# Patient Record
Sex: Female | Born: 2000 | Race: White | Hispanic: No | Marital: Single
Health system: Southern US, Community
[De-identification: ages and names within clinical notes are randomized; demographics above are authoritative.]

## PROBLEM LIST (undated history)

## (undated) DIAGNOSIS — O24419 Gestational diabetes mellitus in pregnancy, unspecified control: Secondary | ICD-10-CM

## (undated) DIAGNOSIS — Z789 Other specified health status: Secondary | ICD-10-CM

## (undated) DIAGNOSIS — D649 Anemia, unspecified: Secondary | ICD-10-CM

## (undated) HISTORY — DX: Other specified health status: Z78.9

## (undated) HISTORY — DX: Gestational diabetes mellitus in pregnancy, unspecified control: O24.419

## (undated) HISTORY — DX: Anemia, unspecified: D64.9

## (undated) HISTORY — PX: FINGER SURGERY: SHX640

---
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
## 2021-01-01 DIAGNOSIS — O99213 Obesity complicating pregnancy, third trimester: Secondary | ICD-10-CM | POA: Diagnosis not present

## 2021-01-01 DIAGNOSIS — O98513 Other viral diseases complicating pregnancy, third trimester: Secondary | ICD-10-CM | POA: Diagnosis not present

## 2021-01-01 DIAGNOSIS — O99013 Anemia complicating pregnancy, third trimester: Secondary | ICD-10-CM | POA: Diagnosis not present

## 2021-01-01 DIAGNOSIS — O0993 Supervision of high risk pregnancy, unspecified, third trimester: Secondary | ICD-10-CM | POA: Diagnosis not present

## 2021-01-03 ENCOUNTER — Other Ambulatory Visit: Payer: Self-pay | Admitting: Obstetrics and Gynecology

## 2021-01-03 NOTE — Progress Notes (Signed)
OB History & Physical   History of Present Illness:  Chief Complaint:   HPI:  Samantha Cabrera is a 20 y.o. G1P0000 female at [redacted]w[redacted]d dated by LMP of 04/01/20.  She presents to L&D for elective IOL.  Pregnancy Issues: 1. S<D 12/13/20 2. COVID in pregnancy  3. Teen pregnancy  4. Obesity - BMI 41 5. Late entry into prenatal care  6. Abnormal thyroid screen 7. Anemia in pregnancy  8. Rh Negative     Prenatal care site: [facility]   EFW: 12/22/20: AUA: [redacted]w[redacted]d EFW: 3295 g 57%ile   Pertinent Results:  Prenatal Labs: Blood type/Rh B neg  Antibody screen neg  Rubella Immune  Varicella Immune  RPR NR  HBsAg Neg  HIV NR  GC neg  Chlamydia neg  Genetic screening declined  1 hour GTT 100  3 hour GTT   GBS Neg   Post Partum Planning: - Infant feeding: TBD - Contraception: TBD - Flu: declined - Tdap: Given 11/28/20  Haroldine Laws, CNM 01/03/2021 8:18 PM

## 2021-01-04 ENCOUNTER — Inpatient Hospital Stay
Admission: EM | Admit: 2021-01-04 | Discharge: 2021-01-06 | DRG: 807 | Disposition: A | Discharge status: Home / Self Care | Payer: Medicaid Other

## 2021-01-04 ENCOUNTER — Encounter: Payer: Self-pay | Admitting: Obstetrics and Gynecology

## 2021-01-04 ENCOUNTER — Other Ambulatory Visit: Payer: Self-pay

## 2021-01-04 ENCOUNTER — Inpatient Hospital Stay: Payer: Medicaid Other | Admitting: Anesthesiology

## 2021-01-04 DIAGNOSIS — D649 Anemia, unspecified: Secondary | ICD-10-CM | POA: Diagnosis present

## 2021-01-04 DIAGNOSIS — Z8616 Personal history of COVID-19: Secondary | ICD-10-CM

## 2021-01-04 DIAGNOSIS — Z6791 Unspecified blood type, Rh negative: Secondary | ICD-10-CM

## 2021-01-04 DIAGNOSIS — O99214 Obesity complicating childbirth: Secondary | ICD-10-CM | POA: Diagnosis present

## 2021-01-04 DIAGNOSIS — Z880 Allergy status to penicillin: Secondary | ICD-10-CM | POA: Diagnosis not present

## 2021-01-04 DIAGNOSIS — O9902 Anemia complicating childbirth: Secondary | ICD-10-CM | POA: Diagnosis not present

## 2021-01-04 DIAGNOSIS — O26893 Other specified pregnancy related conditions, third trimester: Secondary | ICD-10-CM | POA: Diagnosis not present

## 2021-01-04 DIAGNOSIS — Z3A39 39 weeks gestation of pregnancy: Secondary | ICD-10-CM

## 2021-01-04 DIAGNOSIS — Z3490 Encounter for supervision of normal pregnancy, unspecified, unspecified trimester: Secondary | ICD-10-CM

## 2021-01-04 DIAGNOSIS — E669 Obesity, unspecified: Secondary | ICD-10-CM | POA: Diagnosis present

## 2021-01-04 DIAGNOSIS — O26843 Uterine size-date discrepancy, third trimester: Secondary | ICD-10-CM | POA: Insufficient documentation

## 2021-01-04 HISTORY — DX: Encounter for supervision of normal pregnancy, unspecified, unspecified trimester: Z34.90

## 2021-01-04 LAB — CBC
HCT: 32.5 % — ABNORMAL LOW (ref 36.0–46.0)
Hemoglobin: 10.3 g/dL — ABNORMAL LOW (ref 12.0–15.0)
MCH: 25.8 pg — ABNORMAL LOW (ref 26.0–34.0)
MCHC: 31.7 g/dL (ref 30.0–36.0)
MCV: 81.5 fL (ref 80.0–100.0)
Platelets: 196 10*3/uL (ref 150–400)
RBC: 3.99 MIL/uL (ref 3.87–5.11)
RDW: 15.5 % (ref 11.5–15.5)
WBC: 10.9 10*3/uL — ABNORMAL HIGH (ref 4.0–10.5)
nRBC: 0 % (ref 0.0–0.2)

## 2021-01-04 LAB — TYPE AND SCREEN
ABO/RH(D): B NEG
Antibody Screen: POSITIVE

## 2021-01-04 LAB — RPR: RPR Ser Ql: NONREACTIVE

## 2021-01-04 MED ORDER — OXYTOCIN 10 UNIT/ML IJ SOLN
INTRAMUSCULAR | Status: AC
  Filled 2021-01-04: qty 2

## 2021-01-04 MED ORDER — MISOPROSTOL 25 MCG QUARTER TABLET: 25.0000 ug | ORAL_TABLET | ORAL | Status: DC

## 2021-01-04 MED ORDER — ONDANSETRON HCL 4 MG/2ML IJ SOLN
4.0000 mg | Freq: Four times a day (QID) | INTRAMUSCULAR | Status: DC | PRN
  Administered 2021-01-04: 4 mg via INTRAVENOUS
  Filled 2021-01-04: qty 2

## 2021-01-04 MED ORDER — EPHEDRINE 5 MG/ML INJ: 10.0000 mg | INTRAVENOUS | Status: DC | PRN

## 2021-01-04 MED ORDER — LIDOCAINE-EPINEPHRINE (PF) 1.5 %-1:200000 IJ SOLN
INTRAMUSCULAR | Status: DC | PRN
  Administered 2021-01-04: 3 mL via PERINEURAL

## 2021-01-04 MED ORDER — LACTATED RINGERS IV SOLN
500.0000 mL | Freq: Once | INTRAVENOUS | Status: AC
  Administered 2021-01-04: 500 mL via INTRAVENOUS

## 2021-01-04 MED ORDER — OXYTOCIN BOLUS FROM INFUSION
333.0000 mL | Freq: Once | INTRAVENOUS | Status: AC
  Administered 2021-01-05: 333 mL via INTRAVENOUS

## 2021-01-04 MED ORDER — DIPHENHYDRAMINE HCL 50 MG/ML IJ SOLN: 12.5000 mg | INTRAMUSCULAR | Status: DC | PRN

## 2021-01-04 MED ORDER — ACETAMINOPHEN 325 MG PO TABS
650.0000 mg | ORAL_TABLET | ORAL | Status: DC | PRN
  Administered 2021-01-05: 650 mg via ORAL
  Filled 2021-01-04: qty 2

## 2021-01-04 MED ORDER — LACTATED RINGERS IV SOLN
500.0000 mL | INTRAVENOUS | Status: DC | PRN
  Administered 2021-01-04: 500 mL via INTRAVENOUS

## 2021-01-04 MED ORDER — AMMONIA AROMATIC IN INHA
RESPIRATORY_TRACT | Status: AC
  Filled 2021-01-04: qty 10

## 2021-01-04 MED ORDER — LIDOCAINE HCL (PF) 1 % IJ SOLN
INTRAMUSCULAR | Status: DC | PRN
  Administered 2021-01-04: 2 mL

## 2021-01-04 MED ORDER — SOD CITRATE-CITRIC ACID 500-334 MG/5ML PO SOLN: 30.0000 mL | ORAL | Status: DC | PRN

## 2021-01-04 MED ORDER — MISOPROSTOL 25 MCG QUARTER TABLET
25.0000 ug | ORAL_TABLET | Freq: Once | ORAL | Status: AC
  Administered 2021-01-04: 25 ug via BUCCAL
  Filled 2021-01-04: qty 1

## 2021-01-04 MED ORDER — BUPIVACAINE HCL (PF) 0.25 % IJ SOLN
INTRAMUSCULAR | Status: DC | PRN
  Administered 2021-01-04: 3 mL via EPIDURAL
  Administered 2021-01-04: 4 mL via EPIDURAL

## 2021-01-04 MED ORDER — PHENYLEPHRINE 40 MCG/ML (10ML) SYRINGE FOR IV PUSH (FOR BLOOD PRESSURE SUPPORT): 80.0000 ug | PREFILLED_SYRINGE | INTRAVENOUS | Status: DC | PRN

## 2021-01-04 MED ORDER — OXYCODONE-ACETAMINOPHEN 5-325 MG PO TABS: 2.0000 | ORAL_TABLET | ORAL | Status: DC | PRN

## 2021-01-04 MED ORDER — LIDOCAINE HCL (PF) 1 % IJ SOLN
30.0000 mL | INTRAMUSCULAR | Status: DC | PRN
Start: 2021-01-04 — End: 2021-01-05

## 2021-01-04 MED ORDER — MISOPROSTOL 25 MCG QUARTER TABLET
25.0000 ug | ORAL_TABLET | ORAL | Status: DC | PRN
Start: 2021-01-04 — End: 2021-01-05
  Administered 2021-01-04 (×2): 25 ug via VAGINAL
  Filled 2021-01-04 (×2): qty 1

## 2021-01-04 MED ORDER — FENTANYL 2.5 MCG/ML W/ROPIVACAINE 0.15% IN NS 100 ML EPIDURAL (ARMC)
12.0000 mL/h | EPIDURAL | Status: DC
  Administered 2021-01-04 – 2021-01-05 (×2): 12 mL/h via EPIDURAL
  Filled 2021-01-04: qty 100

## 2021-01-04 MED ORDER — FENTANYL 2.5 MCG/ML W/ROPIVACAINE 0.15% IN NS 100 ML EPIDURAL (ARMC)
EPIDURAL | Status: AC
  Filled 2021-01-04: qty 100

## 2021-01-04 MED ORDER — LACTATED RINGERS AMNIOINFUSION
INTRAVENOUS | Status: DC
  Filled 2021-01-04 (×3): qty 1000

## 2021-01-04 MED ORDER — OXYTOCIN-SODIUM CHLORIDE 30-0.9 UT/500ML-% IV SOLN
2.5000 [IU]/h | INTRAVENOUS | Status: DC
  Filled 2021-01-04 (×2): qty 500

## 2021-01-04 MED ORDER — LACTATED RINGERS IV SOLN: INTRAVENOUS | Status: DC

## 2021-01-04 MED ORDER — MISOPROSTOL 200 MCG PO TABS
ORAL_TABLET | ORAL | Status: AC
  Filled 2021-01-04: qty 4

## 2021-01-04 MED ORDER — TERBUTALINE SULFATE 1 MG/ML IJ SOLN: 0.2500 mg | Freq: Once | INTRAMUSCULAR | Status: DC | PRN

## 2021-01-04 MED ORDER — LIDOCAINE HCL (PF) 1 % IJ SOLN
INTRAMUSCULAR | Status: AC
  Filled 2021-01-04: qty 30

## 2021-01-04 MED ORDER — OXYCODONE-ACETAMINOPHEN 5-325 MG PO TABS: 1.0000 | ORAL_TABLET | ORAL | Status: DC | PRN

## 2021-01-04 MED ORDER — OXYTOCIN-SODIUM CHLORIDE 30-0.9 UT/500ML-% IV SOLN
1.0000 m[IU]/min | INTRAVENOUS | Status: DC
  Administered 2021-01-04: 2 m[IU]/min via INTRAVENOUS

## 2021-01-04 NOTE — Progress Notes (Signed)
Labor Progress Note  Samantha Cabrera is a 20 y.o. G1P0000 at [redacted]w[redacted]d by LMP admitted for induction of labor due to Elective at term and covid-19 infection in 3rd trimester.  Subjective: feeling more uncomfortable with contractions  Objective: BP (!) 109/58 (BP Location: Right Arm)   Pulse (!) 102   Temp 98 F (36.7 C) (Oral)   Resp 18   Ht 4\' 11"  (1.499 m)   Wt 102.1 kg   LMP 04/01/2020 (Exact Date) Comment: normal  BMI 45.44 kg/m  Notable VS details: reviewed   Fetal Assessment: FHT:  FHR: 135 bpm, variability: moderate,  accelerations:  Present,  decelerations:  Present prolong and intermittent variables  Category/reactivity:  Category II UC:   regular, every 2-4 minutes - IUPC placed after AROM SVE:   3/70/-2 Membrane status: AROM at 1737 Amniotic color: Clear  Labs: Lab Results  Component Value Date   WBC 10.9 (H) 01/04/2021   HGB 10.3 (L) 01/04/2021   HCT 32.5 (L) 01/04/2021   MCV 81.5 01/04/2021   PLT 196 01/04/2021    Assessment / Plan: S/P spontaneous expulsion of cervical balloon.   -AROM performed for clear fluid and IUPC placed -Plan for amnioinfusion for variable decelerations  -Dr. 03/06/2021 notified of EFM and interventions   Labor: Early induced labor  Preeclampsia:  no signs or symptoms of toxicity Fetal Wellbeing:  Category II  -Intermittent prolong decel and intermittent variables  -Resolve with conservative measures  -Decelerations occur more frequently when using upright or non-lateral positions   Pain Control:  Labor support without medications I/D:  n/a Anticipated MOD:  NSVD  Feliberto Gottron, CNM 01/04/2021, 5:53 PM

## 2021-01-04 NOTE — H&P (Signed)
OB History & Physical   History of Present Illness:  Chief Complaint:   HPI:  Samantha Cabrera is a 20 y.o. G1P0000 female at [redacted]w[redacted]d dated by LMP.  She presents to L&D for elective IOL  She reports:  -active fetal movement -no leakage of fluid -no vaginal bleeding -no contractions  Pregnancy Issues: 1. S<D 12/13/20 2. COVID in pregnancy  3. Teen pregnancy  4. Obesity - BMI 41 5. Late entry into prenatal care  6. Abnormal thyroid screen 7. Anemia in pregnancy  8. Rh Negative    Maternal Medical History:   Past Medical History:  Diagnosis Date   Medical history non-contributory    Patient denies medical problems     Past Surgical History:  Procedure Laterality Date   FINGER SURGERY      Allergies  Allergen Reactions   Penicillins Hives and Swelling    Tongue swells   Augmentin [Amoxicillin-Pot Clavulanate] Hives   Latex Dermatitis   Amoxicillin Rash    Prior to Admission medications   Medication Sig Start Date End Date Taking? Authorizing Provider  ondansetron (ZOFRAN) 4 MG tablet Take 4 mg by mouth every 8 (eight) hours as needed for nausea or vomiting.   Yes [provider]  Prenatal Vit-Fe Fumarate-FA (PRENATAL MULTIVITAMIN) TABS tablet Take 1 tablet by mouth daily at 12 noon.   Yes [provider]  albuterol (PROVENTIL HFA;VENTOLIN HFA) 108 (90 BASE) MCG/ACT inhaler Inhale 2 puffs into the lungs every 6 (six) hours as needed for wheezing or shortness of breath. Patient not taking: No sig reported 08/29/15   Menshew, Charlesetta Ivory, PA-C     Prenatal care site: [facility]   Social History: She  reports that she has never smoked. She has never used smokeless tobacco. She reports that she does not drink alcohol and does not use drugs.  Family History: family history is not on file.   Review of Systems: A full review of systems was performed and negative except as noted in the HPI.    Physical Exam:  Vital Signs: BP  134/80    Pulse (!) 102    Temp 97.8 F (36.6 C) (Oral)    Resp 18    Ht 4\' 11"  (1.499 m)    Wt 102.1 kg    LMP 04/01/2020 (Exact Date) Comment: normal   BMI 45.44 kg/m   General:   alert and cooperative  Skin:  normal  Neurologic:    Alert & oriented x 3  Lungs:   nl effort  Heart:   regular rate and rhythm  Abdomen:  soft, non-tender; bowel sounds normal; no masses,  no organomegaly  Extremities: : non-tender, symmetric    EFW: 12/22/20: AUA: [redacted]w[redacted]d EFW: 3295 g 57%ile  Results for orders placed or performed during the hospital encounter of 01/04/21 (from the past 24 hour(s))  CBC     Status: Abnormal   Collection Time: 01/04/21 12:41 AM  Result Value Ref Range   WBC 10.9 (H) 4.0 - 10.5 K/uL   RBC 3.99 3.87 - 5.11 MIL/uL   Hemoglobin 10.3 (L) 12.0 - 15.0 g/dL   HCT 03/06/21 (L) 25.8 - 52.7 %   MCV 81.5 80.0 - 100.0 fL   MCH 25.8 (L) 26.0 - 34.0 pg   MCHC 31.7 30.0 - 36.0 g/dL   RDW 78.2 42.3 - 53.6 %   Platelets 196 150 - 400 K/uL   nRBC 0.0 0.0 - 0.2 %  Type and screen  Status: None (Preliminary result)   Collection Time: 01/04/21  1:01 AM  Result Value Ref Range   ABO/RH(D) PENDING    Antibody Screen PENDING    Sample Expiration      01/07/2021,2359 Performed at [facility], [address] [place], [place] [address]   Type and screen     Status: None   Collection Time: 01/04/21  1:33 AM  Result Value Ref Range   ABO/RH(D) B NEG    Antibody Screen POS    Sample Expiration 01/07/2021,2359    Antibody Identification      PASSIVELY ACQUIRED ANTI-D Performed at [facility], [address] [place], [place] [address]     Pertinent Results:  Prenatal Labs: Blood type/Rh B neg  Antibody screen neg  Rubella Immune  Varicella Immune  RPR NR  HBsAg Neg  HIV NR  GC neg  Chlamydia neg  Genetic screening declined  1 hour GTT 100  3 hour GTT   GBS Neg   FHT: FHR: 125 bpm, variability: moderate,  accelerations:  Present,  decelerations:   Absent Category/reactivity:  Category I TOCO: occasional SVE: Dilation: 1 / Effacement (%): 40 / Station: -3     Assessment:  Samantha Cabrera is a 20 y.o. G82P0000 female at [redacted]w[redacted]d here for IOL   Plan:  1. Admit to Labor & Delivery; consents reviewed and obtained  2. Fetal Well being  - Fetal Tracing: Cat I - GBS Neg - Presentation: VTX confirmed by SVE   3. Routine OB: - Prenatal labs reviewed, as above - Rh neg - CBC & T&S on admit - Clear fluids, IVF  4. Induction of Labor -  Contractions external toco in place -  Plan for induction with Cytotec and Pitocin -  Plan for continuous fetal monitoring  -  Maternal pain control as desired: IVPM, nitrous, regional anesthesia - Anticipate vaginal delivery  5. Post Partum Planning: - Infant feeding: TBD - Contraception: TBD - Flu: declined - Tdap: Given 11/28/20  Haroldine Laws, CNM 01/04/2021 7:25 AM

## 2021-01-04 NOTE — Anesthesia Procedure Notes (Signed)
Epidural Patient location during procedure: OB Start time: 01/04/2021 6:30 PM  Staffing Performed: resident/CRNA   Preanesthetic Checklist Completed: patient identified, IV checked, site marked, risks and benefits discussed, surgical consent, monitors and equipment checked, pre-op evaluation and timeout performed  Epidural Patient position: sitting Prep: ChloraPrep Patient monitoring: heart rate and blood pressure Approach: midline Location: L3-L4 Injection technique: LOR saline and LOR air  Needle:  Needle type: Tuohy  Needle gauge: 17 G Needle length: 9 cm Needle insertion depth: 8 cm Catheter at skin depth: 13 cm Test dose: negative and 1.5% lidocaine with Epi 1:200 K  Assessment Events: blood not aspirated, injection not painful, no injection resistance, no paresthesia and negative IV test

## 2021-01-04 NOTE — Progress Notes (Signed)
Pt returned from bathroom, FHR Tracing not showing up on Monitor. Paper strip initiated and RN at bedside. SCC and CNM notified.

## 2021-01-04 NOTE — Progress Notes (Signed)
Labor Progress Note  Samantha Cabrera is a 20 y.o. G1P0000 at [redacted]w[redacted]d by LMP admitted for induction of labor due to Elective at term and covid-19 infection in 3rd trimester.  Subjective: feeling more cramping   Objective: BP (!) 109/58 (BP Location: Right Arm)   Pulse (!) 102   Temp 98 F (36.7 C) (Oral)   Resp 18   Ht 4\' 11"  (1.499 m)   Wt 102.1 kg   LMP 04/01/2020 (Exact Date) Comment: normal  BMI 45.44 kg/m  Notable VS details: reviewed   Fetal Assessment: FHT:  FHR: 125 bpm, variability: moderate,  accelerations:  Present,  decelerations:  Present intermittent variables  Category/reactivity:  Category II UC:   irregular, every 2-5 minutes SVE:   1-2/70/-2/soft/mid - cervical balloon placed with 29ml fluid   Membrane status: Intact Amniotic color: N/A  Labs: Lab Results  Component Value Date   WBC 10.9 (H) 01/04/2021   HGB 10.3 (L) 01/04/2021   HCT 32.5 (L) 01/04/2021   MCV 81.5 01/04/2021   PLT 196 01/04/2021    Assessment / Plan: Induction of labor d/t elective at term and covid-19 infection in 3rd trimester  -Cervical balloon placed for continued cervical ripening  -Will start oxytocin for continued active management   Labor: S/P 2 doses of vaginal misoprostol and 1 dose of buccal misoprostol.  Cervical balloon placed for continued cervical ripening. Preeclampsia:  no signs or symptoms of toxicity Fetal Wellbeing:  Category II for intermittent variables  -Overall reassuring with moderate variability and accels.   -Variables resolve with conservative measures Pain Control:  Labor support without medications I/D:  n/a Anticipated MOD:  NSVD  03/06/2021, CNM 01/04/2021, 1:38 PM

## 2021-01-04 NOTE — Progress Notes (Signed)
Labor Progress Note  Samantha Cabrera is a 20 y.o. G1P0000 at [redacted]w[redacted]d by LMP admitted for induction of labor due to Elective at term and covid-19 infection in 3rd trimester.  Subjective: Feeling comfortable after epidural  Objective: BP 131/75   Pulse 90   Temp 97.8 F (36.6 C) (Oral)   Resp 16   Ht 4\' 11"  (1.499 m)   Wt 102.1 kg   LMP 04/01/2020 (Exact Date) Comment: normal  SpO2 98%   BMI 45.44 kg/m  Notable VS details: reviewed   Fetal Assessment: FHT:  FHR: 135 bpm, variability: moderate,  accelerations:  Present,  decelerations:  Present intermittent variables - FSE placed Category/reactivity:  Category II UC:   irregular, every 2-6 minutes, MVU's 170 SVE:  4/60/-2  Membrane status: AROM at 1737 Amniotic color: Clear   Labs: Lab Results  Component Value Date   WBC 10.9 (H) 01/04/2021   HGB 10.3 (L) 01/04/2021   HCT 32.5 (L) 01/04/2021   MCV 81.5 01/04/2021   PLT 196 01/04/2021    Assessment / Plan: Early induced labor  -FSE placed  -MVU's not adequate  -Oxytocin turned off briefly d/t prolong deceleration, only infusing at 3 milliunits/min  -turned back on 2 milliunits/min    Labor: Progressing normally Preeclampsia:  no signs or symptoms of toxicity Fetal Wellbeing:  Category II for intermittent prolong decelerations and variables -Moderate variability and accels present  Pain Control:  Epidural I/D:  n/a Anticipated MOD:  NSVD  03/06/2021, CNM 01/04/2021, 8:02 PM

## 2021-01-04 NOTE — Anesthesia Preprocedure Evaluation (Signed)
Anesthesia Evaluation  Patient identified by MRN, date of birth, ID band Patient awake    History of Anesthesia Complications Negative for: history of anesthetic complications  Airway Mallampati: II  TM Distance: >3 FB Neck ROM: Full    Dental  (+) Teeth Intact   Pulmonary  Covid infection third trimester.  No current sxs.          Cardiovascular negative cardio ROS   Rhythm:Regular Rate:Normal     Neuro/Psych negative neurological ROS  negative psych ROS   GI/Hepatic Neg liver ROS, GERD  ,  Endo/Other  negative endocrine ROS  Renal/GU negative Renal ROS  negative genitourinary   Musculoskeletal negative musculoskeletal ROS (+)   Abdominal   Peds negative pediatric ROS (+)  Hematology negative hematology ROS (+)   Anesthesia Other Findings   Reproductive/Obstetrics (+) Pregnancy                             Anesthesia Physical Anesthesia Plan  ASA: II  Anesthesia Plan: Epidural   Post-op Pain Management:    Induction:   PONV Risk Score and Plan:   Airway Management Planned:   Additional Equipment:   Intra-op Plan:   Post-operative Plan:   Informed Consent: I have reviewed the patients History and Physical, chart, labs and discussed the procedure including the risks, benefits and alternatives for the proposed anesthesia with the patient or authorized representative who has indicated his/her understanding and acceptance.       Plan Discussed with: Anesthesiologist, Surgeon and CRNA  Anesthesia Plan Comments:         Anesthesia Quick Evaluation

## 2021-01-04 NOTE — Progress Notes (Signed)
CNM Mackie notified me of the severe variables 15 min ago. AROm clear . IUPC in place .  preceding variable decels reasuring fetal monitoring  CNM will try to place FSE and will start an Amnioinfusion Continue monitoring , position changes and watch for hyperstimulation

## 2021-01-04 NOTE — Progress Notes (Signed)
Labor Progress Note  Samantha Cabrera is a 20 y.o. G1P0000 at [redacted]w[redacted]d by LMP admitted for induction of labor due to Elective at term and covid-19 infection in pregnancy.  Subjective: feeling comfortable with epidural, intermittently feels pelvic pressure   Objective: BP 125/74   Pulse 79   Temp 98.2 F (36.8 C) (Oral)   Resp 16   Ht 4\' 11"  (1.499 m)   Wt 102.1 kg   LMP 04/01/2020 (Exact Date) Comment: normal  SpO2 97%   BMI 45.44 kg/m  Notable VS details: reviewed   Fetal Assessment: FHT:  FHR: 130 bpm, variability: moderate,  accelerations:  Present,  decelerations:  Present occasional variables Category/reactivity:  Category II UC:   regular, every 1-4 minutes; MVU's 205 SVE:   5/80/-2/soft/ant Membrane status: AROM at 1737 Amniotic color: clear  Labs: Lab Results  Component Value Date   WBC 10.9 (H) 01/04/2021   HGB 10.3 (L) 01/04/2021   HCT 32.5 (L) 01/04/2021   MCV 81.5 01/04/2021   PLT 196 01/04/2021    Assessment / Plan: Induction of labor due to elective at term and covid-19 infection in pregnancy,  progressing well on pitocin  -MVU's ranging from 180-210 -Continue to titrate oxytocin   Labor: Progressing normally Preeclampsia:  no signs or symptoms of toxicity Fetal Wellbeing:  Category II for occasional variables  -overall reassuring with moderate variability and accelerations  Pain Control:  Epidural I/D:  n/a Anticipated MOD:  NSVD  03/06/2021, CNM 01/04/2021, 11:12 PM

## 2021-01-05 ENCOUNTER — Encounter: Payer: Self-pay | Admitting: Obstetrics and Gynecology

## 2021-01-05 DIAGNOSIS — Z3A39 39 weeks gestation of pregnancy: Secondary | ICD-10-CM | POA: Diagnosis not present

## 2021-01-05 MED ORDER — PRENATAL MULTIVITAMIN CH
ORAL_TABLET | ORAL | Status: AC
  Filled 2021-01-05: qty 1

## 2021-01-05 MED ORDER — DOCUSATE SODIUM 100 MG PO CAPS
100.0000 mg | ORAL_CAPSULE | Freq: Two times a day (BID) | ORAL | Status: DC
  Administered 2021-01-05 – 2021-01-06 (×2): 100 mg via ORAL
  Filled 2021-01-05 (×2): qty 1

## 2021-01-05 MED ORDER — ONDANSETRON HCL 4 MG/2ML IJ SOLN: 4.0000 mg | INTRAMUSCULAR | Status: DC | PRN

## 2021-01-05 MED ORDER — ACETAMINOPHEN 500 MG PO TABS
1000.0000 mg | ORAL_TABLET | Freq: Four times a day (QID) | ORAL | Status: DC | PRN
  Administered 2021-01-05: 1000 mg via ORAL
  Filled 2021-01-05: qty 2

## 2021-01-05 MED ORDER — BENZOCAINE-MENTHOL 20-0.5 % EX AERO
1.0000 "application " | INHALATION_SPRAY | CUTANEOUS | Status: DC | PRN
  Administered 2021-01-05: 1 via TOPICAL
  Filled 2021-01-05: qty 56

## 2021-01-05 MED ORDER — WITCH HAZEL-GLYCERIN EX PADS
1.0000 "application " | MEDICATED_PAD | CUTANEOUS | Status: DC
  Administered 2021-01-05: 1 via TOPICAL
  Filled 2021-01-05: qty 100

## 2021-01-05 MED ORDER — IBUPROFEN 600 MG PO TABS
600.0000 mg | ORAL_TABLET | Freq: Four times a day (QID) | ORAL | Status: DC
  Administered 2021-01-05 – 2021-01-06 (×5): 600 mg via ORAL
  Filled 2021-01-05 (×5): qty 1

## 2021-01-05 MED ORDER — DIBUCAINE (PERIANAL) 1 % EX OINT
1.0000 "application " | TOPICAL_OINTMENT | CUTANEOUS | Status: DC | PRN
  Administered 2021-01-05: 1 via RECTAL
  Filled 2021-01-05: qty 28

## 2021-01-05 MED ORDER — DIPHENHYDRAMINE HCL 25 MG PO CAPS: 25.0000 mg | ORAL_CAPSULE | Freq: Four times a day (QID) | ORAL | Status: DC | PRN

## 2021-01-05 MED ORDER — METHYLERGONOVINE MALEATE 0.2 MG/ML IJ SOLN
INTRAMUSCULAR | Status: AC
  Administered 2021-01-05: 0.2 mg via INTRAMUSCULAR
  Filled 2021-01-05: qty 1

## 2021-01-05 MED ORDER — COCONUT OIL OIL: 1.0000 "application " | TOPICAL_OIL | Status: DC | PRN

## 2021-01-05 MED ORDER — ONDANSETRON HCL 4 MG PO TABS
4.0000 mg | ORAL_TABLET | ORAL | Status: DC | PRN
  Filled 2021-01-05: qty 1

## 2021-01-05 MED ORDER — PRENATAL MULTIVITAMIN CH
1.0000 | ORAL_TABLET | Freq: Every day | ORAL | Status: DC
  Administered 2021-01-05 – 2021-01-06 (×2): 1 via ORAL
  Filled 2021-01-05: qty 1

## 2021-01-05 MED ORDER — SIMETHICONE 80 MG PO CHEW: 80.0000 mg | CHEWABLE_TABLET | ORAL | Status: DC | PRN

## 2021-01-05 NOTE — Discharge Summary (Addendum)
Obstetrical Discharge Summary  Patient Name: Samantha Cabrera DOB: Dec 17, 2000 MRN: [id number]  Date of Admission: 01/04/2021 Date of Delivery: 01/05/2021 Delivered by: Margaretmary Eddy, CNM  Date of Discharge: 01/06/2021  Primary OB: [facility] [id number] last menstrual period was 04/01/2020 (exact date). EDC Estimated Date of Delivery: 01/06/21 Gestational Age at Delivery: [redacted]w[redacted]d   Antepartum complications:  1. S<D 12/13/20 2. COVID in pregnancy  3. Teen pregnancy  4. Obesity - BMI 41 5. Late entry into prenatal care  6. Abnormal thyroid screen 7. Anemia in pregnancy  8. Rh Negative   Admitting Diagnosis: Elective IOL at term Secondary Diagnosis: SVD Patient Active Problem List   Diagnosis Date Noted   Encounter for elective induction of labor 01/04/2021   Uterine size date discrepancy, third trimester 01/04/2021   Encounter for supervision of normal pregnancy in third trimester 08/16/2020     Induction: AROM, Pitocin, Cytotec and IP Foley Complications: None Intrapartum complications/course:  Samantha Cabrera presented to L&D for a scheduled IOL.  She had a protracted latent phase but progressed quickly during active labor.  She had a strong urge to push and pushed effectively over approximately 40 min Delivery Type: spontaneous vaginal delivery Anesthesia: epidural Placenta: spontaneous Laceration: Periurethral, 1st degree hymenal laceration Episiotomy: none Newborn Data: Live born female "RaeLynn" Birth Weight:  7lbs 11oz APGAR: 9, 9  Newborn Delivery   Birth date/time: 01/05/2021 05:16:00 Delivery type: Vaginal, Spontaneous      Postpartum Procedures: none  Edinburgh:  Edinburgh Postnatal Depression Scale Screening Tool 01/05/2021  I have been able to laugh and see the funny side of things. 0  I have looked forward with enjoyment to things. 0  I have blamed myself unnecessarily when things went wrong. 1  I have been anxious or worried for no good reason. 0  I have  felt scared or panicky for no good reason. 0  Things have been getting on top of me. 0  I have been so unhappy that I have had difficulty sleeping. 0  I have felt sad or miserable. 0  I have been so unhappy that I have been crying. 0  The thought of harming myself has occurred to me. 0  Edinburgh Postnatal Depression Scale Total 1     Post partum course:  Patient had an uncomplicated postpartum course.  By time of discharge on PPD#1, her pain was controlled on oral pain medications; she had appropriate lochia and was ambulating, voiding without difficulty and tolerating regular diet.  She was deemed stable for discharge to home.    Discharge Physical Exam:  BP 133/89 (BP Location: Right Arm)    Pulse 82    Temp (!) 97.5 F (36.4 C) (Oral)    Resp 18    Ht 4\' 11"  (1.499 m)    Wt 102.1 kg    LMP 04/01/2020 (Exact Date) Comment: normal   SpO2 98%    Breastfeeding Unknown    BMI 45.44 kg/m   General: NAD CV: RRR Pulm: CTABL, nl effort ABD: s/nd/nt, fundus firm and below the umbilicus Lochia: moderate Perineum:minimal edema/repair well approximated DVT Evaluation: LE non-ttp, no evidence of DVT on exam.  Hemoglobin  Date Value Ref Range Status  01/06/2021 8.2 (L) 12.0 - 15.0 g/dL Final   HGB  Date Value Ref Range Status  05/13/2014 12.7 11.5 - 15.5 g/dL Final   HCT  Date Value Ref Range Status  01/06/2021 25.9 (L) 36.0 - 46.0 % Final  05/13/2014 37.7 35.0 - 45.0 %  Final     Disposition: stable, discharge to home. Baby Feeding: formula Baby Disposition: home with mom  Rh Immune globulin given: Rh neg - Rhogam not indicated, baby Rh negative.  Rubella vaccine given: Immune Varivax vaccine given: Immune Flu vaccine given in AP or PP setting: declined  Tdap vaccine given in AP or PP setting: given 11/28/2020  Contraception: PIlls- desires to start at 6wks  Prenatal Labs:  Blood type/Rh B neg  Antibody screen neg  Rubella Immune  Varicella Immune  RPR NR  HBsAg Neg   HIV NR  GC neg  Chlamydia neg  Genetic screening declined  1 hour GTT 100  3 hour GTT   GBS Neg    Plan:  Samantha Cabrera was discharged to home in good condition. Follow-up appointment with delivering provider in 6 weeks.  Discharge Medications: Allergies as of 01/06/2021      Reactions   Penicillins Hives, Swelling   Tongue swells   Augmentin [amoxicillin-pot Clavulanate] Hives   Latex Dermatitis   Amoxicillin Rash      Medication List    TAKE these medications   acetaminophen 500 MG tablet Commonly known as: TYLENOL Take 2 tablets (1,000 mg total) by mouth every 6 (six) hours as needed (for pain scale < 4).   albuterol 108 (90 Base) MCG/ACT inhaler Commonly known as: VENTOLIN HFA Inhale 2 puffs into the lungs every 6 (six) hours as needed for wheezing or shortness of breath.   benzocaine-Menthol 20-0.5 % Aero Commonly known as: DERMOPLAST Apply 1 application topically as needed for irritation (perineal discomfort).   coconut oil Oil Apply 1 application topically as needed.   docusate sodium 100 MG capsule Commonly known as: COLACE Take 1 capsule (100 mg total) by mouth 2 (two) times daily.   ferrous sulfate 325 (65 FE) MG tablet Take 1 tablet (325 mg total) by mouth 2 (two) times daily with a meal. For anemia, take with Vitamin C   ibuprofen 600 MG tablet Commonly known as: ADVIL Take 1 tablet (600 mg total) by mouth every 6 (six) hours.   prenatal multivitamin Tabs tablet Take 1 tablet by mouth daily at 12 noon.   witch hazel-glycerin pad Commonly known as: TUCKS Apply 1 application topically continuous.        Follow-up Information    Gustavo Lah, CNM. Schedule an appointment as soon as possible for a visit in 6 week(s).   Specialty: Certified Nurse Midwife Why: postpartum visit  Contact information: [address] [place] [address] [phone]               Signed: Randa Ngo, CNM 01/06/2021 9:53 AM

## 2021-01-05 NOTE — Progress Notes (Signed)
Labor Progress Note  Samantha Cabrera is a 20 y.o. G1P0000 at [redacted]w[redacted]d by LMP admitted for induction of labor due to Elective at term and covid-19 infection in 3rd trimester.  Subjective: feeling more pressure   Objective: BP (!) 111/57 (BP Location: Left Arm)   Pulse 77   Temp 98.1 F (36.7 C) (Oral)   Resp 16   Ht 4\' 11"  (1.499 m)   Wt 102.1 kg   LMP 04/01/2020 (Exact Date) Comment: normal  SpO2 97%   BMI 45.44 kg/m  Notable VS details: reviewed   Fetal Assessment: FHT:  FHR: 130 bpm, variability: moderate,  accelerations:  Present,  decelerations:  Present intermittent variables Category/reactivity:  Category II UC:   regular, every 2-4 minutes, MVU's 225 SVE:   6/90/0 Membrane status: AROM at 1737 Amniotic color: clear  Labs: Lab Results  Component Value Date   WBC 10.9 (H) 01/04/2021   HGB 10.3 (L) 01/04/2021   HCT 32.5 (L) 01/04/2021   MCV 81.5 01/04/2021   PLT 196 01/04/2021    Assessment / Plan: Induction of labor due to elective at term and covid-19 infection in 3rd trimester,  progressing well on pitocin  Labor: Progressing normally Preeclampsia:  no signs or symptoms of toxicity Fetal Wellbeing:  Category II - for intermittent variables  -overall reassuring with moderate variability and accels  Pain Control:  Epidural I/D:  SROM x 10 hours, afebrile, GBS neg Anticipated MOD:  NSVD  03/06/2021, CNM 01/05/2021, 3:14 AM

## 2021-01-05 NOTE — Progress Notes (Signed)
Samantha Cabrera is stable after delivery. Pt's support person is at bedside. Pt bonding with infant and performing skin to skin after delivery. Epidural catheter removed by RN, tip intact, no bleeding noted at site. Pt is stable and ambulatory. Pt ambulated to the bathroom, voided, and tolerated activity. Pt transferred to mother/baby unit RM 350 . Report given to Apple Donna,RN

## 2021-01-06 LAB — CBC
HCT: 25.9 % — ABNORMAL LOW (ref 36.0–46.0)
Hemoglobin: 8.2 g/dL — ABNORMAL LOW (ref 12.0–15.0)
MCH: 25.6 pg — ABNORMAL LOW (ref 26.0–34.0)
MCHC: 31.7 g/dL (ref 30.0–36.0)
MCV: 80.9 fL (ref 80.0–100.0)
Platelets: 154 10*3/uL (ref 150–400)
RBC: 3.2 MIL/uL — ABNORMAL LOW (ref 3.87–5.11)
RDW: 15.7 % — ABNORMAL HIGH (ref 11.5–15.5)
WBC: 11.4 10*3/uL — ABNORMAL HIGH (ref 4.0–10.5)
nRBC: 0 % (ref 0.0–0.2)

## 2021-01-06 MED ORDER — IBUPROFEN 600 MG PO TABS: 600.0000 mg | ORAL_TABLET | Freq: Four times a day (QID) | ORAL | 0 refills | Status: DC

## 2021-01-06 MED ORDER — COCONUT OIL OIL: 1.0000 "application " | TOPICAL_OIL | 0 refills | Status: DC | PRN

## 2021-01-06 MED ORDER — FERROUS SULFATE 325 (65 FE) MG PO TABS: 325.0000 mg | ORAL_TABLET | Freq: Two times a day (BID) | ORAL | 3 refills | Status: DC

## 2021-01-06 MED ORDER — DOCUSATE SODIUM 100 MG PO CAPS: 100.0000 mg | ORAL_CAPSULE | Freq: Two times a day (BID) | ORAL | 0 refills | Status: DC

## 2021-01-06 MED ORDER — ACETAMINOPHEN 500 MG PO TABS: 1000.0000 mg | ORAL_TABLET | Freq: Four times a day (QID) | ORAL | 0 refills | Status: DC | PRN

## 2021-01-06 MED ORDER — WITCH HAZEL-GLYCERIN EX PADS: 1.0000 "application " | MEDICATED_PAD | CUTANEOUS | 12 refills | Status: DC

## 2021-01-06 MED ORDER — BENZOCAINE-MENTHOL 20-0.5 % EX AERO: 1.0000 "application " | INHALATION_SPRAY | CUTANEOUS | Status: DC | PRN

## 2021-01-06 NOTE — Progress Notes (Signed)
Post Partum Day 0- day of delivery Subjective: Doing well, no complaints.  Tolerating regular diet, pain with PO meds, voiding and ambulating without difficulty.  No CP SOB Fever,Chills, N/V or leg pain; denies nipple or breast pain no HA change of vision, RUQ/epigastric pain  Objective: BP 133/89 (BP Location: Right Arm)   Pulse 82   Temp (!) 97.5 F (36.4 C) (Oral)   Resp 18   Ht 4\' 11"  (1.499 m)   Wt 102.1 kg   LMP 04/01/2020 (Exact Date) Comment: normal  SpO2 98%   Breastfeeding Unknown   BMI 45.44 kg/m    Physical Exam:  General: NAD Breasts: soft/nontender CV: RRR Pulm: nl effort, CTABL Abdomen: soft, NT, BS x 4 Perineum: minimal edema, laceration repair well approximated Lochia: moderate Uterine Fundus: fundus firm and 1 fb below umbilicus DVT Evaluation: no cords, ttp LEs   Recent Labs    01/04/21 0041 01/06/21 0640  HGB 10.3* 8.2*  HCT 32.5* 25.9*  WBC 10.9* 11.4*  PLT 196 154    Assessment/Plan: 20 y.o. G1P1001 postpartum day # 0  - Continue routine PP care - encouraged snug fitting bra and cabbage leaves for bottlefeeding.  - no concerns, desires DC home tomorrow if possible.     Disposition: Does not desire Dc home today.     03/08/21, CNM 01/05/21 at 1800

## 2021-01-06 NOTE — Discharge Instructions (Signed)
Vaginal Delivery, Care After Refer to this sheet in the next few weeks. These discharge instructions provide you with information on caring for yourself after delivery. Your caregiver may also give you specific instructions. Your treatment has been planned according to the most current medical practices available, but problems sometimes occur. Call your caregiver if you have any problems or questions after you go home. HOME CARE INSTRUCTIONS 1. Take over-the-counter or prescription medicines only as directed by your caregiver or pharmacist. 2. Do not drink alcohol, especially if you are breastfeeding or taking medicine to relieve pain. 3. Do not smoke tobacco. 4. Continue to use good perineal care. Good perineal care includes: 1. Wiping your perineum from back to front 2. Keeping your perineum clean. 3. You can do sitz baths twice a day, to help keep this area clean 5. Do not use tampons, douche or have sex until your caregiver says it is okay. 6. Shower only and avoid sitting in submerged water, aside from sitz baths 7. Wear a well-fitting bra that provides breast support. 8. Eat healthy foods. 9. Drink enough fluids to keep your urine clear or pale yellow. 10. Eat high-fiber foods such as whole grain cereals and breads, brown rice, beans, and fresh fruits and vegetables every day. These foods may help prevent or relieve constipation. 11. Avoid constipation with high fiber foods or medications, such as miralax or metamucil 12. Follow your caregiver's recommendations regarding resumption of activities such as climbing stairs, driving, lifting, exercising, or traveling. 13. Talk to your caregiver about resuming sexual activities. Resumption of sexual activities is dependent upon your risk of infection, your rate of healing, and your comfort and desire to resume sexual activity. 14. Try to have someone help you with your household activities and your newborn for at least a few days after you leave  the hospital. 15. Rest as much as possible. Try to rest or take a nap when your newborn is sleeping. 16. Increase your activities gradually. 17. Keep all of your scheduled postpartum appointments. It is very important to keep your scheduled follow-up appointments. At these appointments, your caregiver will be checking to make sure that you are healing physically and emotionally. SEEK MEDICAL CARE IF:   You are passing large clots from your vagina. Save any clots to show your caregiver.  You have a foul smelling discharge from your vagina.  You have trouble urinating.  You are urinating frequently.  You have pain when you urinate.  You have a change in your bowel movements.  You have increasing redness, pain, or swelling near your vaginal incision (episiotomy) or vaginal tear.  You have pus draining from your episiotomy or vaginal tear.  Your episiotomy or vaginal tear is separating.  You have painful, hard, or reddened breasts.  You have a severe headache.  You have blurred vision or see spots.  You feel sad or depressed.  You have thoughts of hurting yourself or your newborn.  You have questions about your care, the care of your newborn, or medicines.  You are dizzy or light-headed.  You have a rash.  You have nausea or vomiting.  You were breastfeeding and have not had a menstrual period within 12 weeks after you stopped breastfeeding.  You are not breastfeeding and have not had a menstrual period by the 12th week after delivery.  You have a fever. SEEK IMMEDIATE MEDICAL CARE IF:   You have persistent pain.  You have chest pain.  You have shortness of breath.    You faint.  You have leg pain.  You have stomach pain.  Your vaginal bleeding saturates two or more sanitary pads in 1 hour. MAKE SURE YOU:   Understand these instructions.  Will watch your condition.  Will get help right away if you are not doing well or get worse. Document Released:  10/18/2000 Document Revised: 03/07/2014 Document Reviewed: 06/17/2012 ExitCare Patient Information 2015 [employer]. This information is not intended to replace advice given to you by your health care provider. Make sure you discuss any questions you have with your health care provider.  Sitz Bath A sitz bath is a warm water bath taken in the sitting position. The water covers only the hips and butt (buttocks). We recommend using one that fits in the toilet, to help with ease of use and cleanliness. It may be used for either healing or cleaning purposes. Sitz baths are also used to relieve pain, itching, or muscle tightening (spasms). The water may contain medicine. Moist heat will help you heal and relax.  HOME CARE  Take 3 to 4 sitz baths a day. 18. Fill the bathtub half-full with warm water. 19. Sit in the water and open the drain a little. 20. Turn on the warm water to keep the tub half-full. Keep the water running constantly. 21. Soak in the water for 15 to 20 minutes. 22. After the sitz bath, pat the affected area dry. GET HELP RIGHT AWAY IF: You get worse instead of better. Stop the sitz baths if you get worse. MAKE SURE YOU:  Understand these instructions.  Will watch your condition.  Will get help right away if you are not doing well or get worse. Document Released: 11/28/2004 Document Revised: 07/15/2012 Document Reviewed: 02/18/2011 ExitCare Patient Information 2015 [employer]. This information is not intended to replace advice given to you by your health care provider. Make sure you discuss any questions you have with your health care provider.   Postpartum Baby Blues The postpartum period begins right after the birth of a baby. During this time, there is often joy and excitement. It is also a time of many changes in the life of the parents. A mother may feel happy one minute and sad or stressed the next. These feelings of sadness, called the baby blues, usually happen  in the period right after the baby is born and go away within a week or two. What are the causes? The exact cause of this condition is not known. Changes in hormone levels after childbirth are believed to trigger some of the symptoms. Other factors that can play a role in these mood changes include:  Lack of sleep.  Stressful life events, such as financial problems, caring for a loved one, or death of a loved one.  Genetics. What are the signs or symptoms? Symptoms of this condition include:  Changes in mood, such as going from extreme happiness to sadness.  A decrease in concentration.  Difficulty sleeping.  Crying spells and tearfulness.  Loss of appetite.  Irritability.  Anxiety. If these symptoms last for more than 2 weeks or become more severe, you may have postpartum depression. How is this diagnosed? This condition is diagnosed based on an evaluation of your symptoms. Your health care provider may use a screening tool that includes a list of questions to help identify a person with the baby blues or postpartum depression. How is this treated? The baby blues usually go away on their own in 1-2 weeks. Social support is   often what is needed. You will be encouraged to get adequate sleep and rest. Follow these instructions at home: Lifestyle  Get as much rest as you can. Take a nap when the baby sleeps.  Exercise regularly as told by your health care provider. Some women find yoga and walking to be helpful.  Eat a balanced and nourishing diet. This includes plenty of fruits and vegetables, whole grains, and lean proteins.  Do little things that you enjoy. Take a bubble bath, read your favorite magazine, or listen to your favorite music.  Avoid alcohol.  Ask for help with household chores, cooking, grocery shopping, or running errands. Do not try to do everything yourself. Consider hiring a postpartum doula to help. This is a professional who specializes in providing  support to new mothers.  Try not to make any major life changes during pregnancy or right after giving birth. This can add stress.      General instructions  Talk to people close to you about how you are feeling. Get support from your partner, family members, friends, or other new moms. You may want to join a support group.  Find ways to manage stress. This may include: ? Writing your thoughts and feelings in a journal. ? Spending time outside. ? Spending time with people who make you laugh.  Try to stay positive in how you think. Think about the things you are grateful for.  Take over-the-counter and prescription medicines only as told by your health care provider.  Let your health care provider know if you have any concerns.  Keep all postpartum visits. This is important. Contact a health care provider if:  Your baby blues do not go away after 2 weeks. Get help right away if:  You have thoughts of taking your own life (suicidal thoughts), or of harming your baby or someone else.  You see or hear things that are not there (hallucinations). If you ever feel like you may hurt yourself or others, or have thoughts about taking your own life, get help right away. Go to your nearest emergency department or:  Call your local emergency services (911 in the [place]  Call a suicide crisis helpline, such as the [facility], at (854)021-2577. This is open 24 hours a day in the [place]  Text the Crisis Text Line at (845)082-5269 (in the [place] Summary  After giving birth, you may feel happy one minute and sad or stressed the next. Feelings of sadness that happen right after the baby is born and go away after a week or two are called the baby blues.  You can manage the baby blues by getting enough rest, eating a healthy diet, exercising, spending time with supportive people, and finding ways to manage stress.  If feelings of sadness and stress last longer than 2 weeks or  get in the way of caring for your baby, talk with your health care provider. This may mean you have postpartum depression. This information is not intended to replace advice given to you by your health care provider. Make sure you discuss any questions you have with your health care provider. Document Revised: 04/14/2020 Document Reviewed: 04/14/2020 Elsevier Patient Education  2021 [employer].

## 2021-01-06 NOTE — Progress Notes (Signed)
Pt discharged with infant. Discharge instructions, prescriptions, and follow up appointments given to and reviewed with patient. Pt verbalized understanding. To be escorted out by auxillary.  °

## 2021-01-06 NOTE — Anesthesia Postprocedure Evaluation (Signed)
Anesthesia Post Note  Patient: Samantha Cabrera  Procedure(s) Performed: AN AD HOC LABOR EPIDURAL  Patient location during evaluation: Mother Baby Anesthesia Type: Epidural Level of consciousness: awake and alert Pain management: pain level controlled Vital Signs Assessment: post-procedure vital signs reviewed and stable Respiratory status: spontaneous breathing, nonlabored ventilation and respiratory function stable Cardiovascular status: stable Postop Assessment: no headache, no backache and epidural receding Anesthetic complications: no   No complications documented.   Last Vitals:  Vitals:   01/05/21 2351 01/06/21 0754  BP: 112/73 133/89  Pulse: 95 82  Resp: 20 18  Temp: 36.8 C (!) 36.4 C  SpO2: 97% 98%    Last Pain:  Vitals:   01/06/21 0950  TempSrc:   PainSc: 0-No pain                 Karleen Hampshire

## 2021-01-09 LAB — SURGICAL PATHOLOGY

## 2021-02-22 DIAGNOSIS — R300 Dysuria: Secondary | ICD-10-CM | POA: Diagnosis not present

## 2021-02-22 DIAGNOSIS — Z30011 Encounter for initial prescription of contraceptive pills: Secondary | ICD-10-CM | POA: Diagnosis not present

## 2021-02-28 ENCOUNTER — Other Ambulatory Visit: Payer: Self-pay

## 2021-02-28 ENCOUNTER — Encounter: Payer: Self-pay | Admitting: Family Medicine

## 2021-02-28 ENCOUNTER — Ambulatory Visit (LOCAL_COMMUNITY_HEALTH_CENTER): Payer: Medicaid Other | Admitting: Family Medicine

## 2021-02-28 VITALS — BP 115/68 | Ht 59.0 in | Wt 216.0 lb

## 2021-02-28 DIAGNOSIS — Z3009 Encounter for other general counseling and advice on contraception: Secondary | ICD-10-CM | POA: Diagnosis not present

## 2021-02-28 DIAGNOSIS — Z30017 Encounter for initial prescription of implantable subdermal contraceptive: Secondary | ICD-10-CM | POA: Diagnosis not present

## 2021-02-28 DIAGNOSIS — Z3202 Encounter for pregnancy test, result negative: Secondary | ICD-10-CM

## 2021-02-28 LAB — PREGNANCY, URINE: Preg Test, Ur: NEGATIVE

## 2021-02-28 MED ORDER — ETONOGESTREL 68 MG ~~LOC~~ IMPL
68.0000 mg | DRUG_IMPLANT | Freq: Once | SUBCUTANEOUS | Status: AC
Start: 2021-02-28 — End: 2021-02-28
  Administered 2021-02-28: 68 mg via SUBCUTANEOUS

## 2021-02-28 NOTE — Progress Notes (Addendum)
Here today for Nexplanon Insertion. Had recent PP exam at [facility] 02/22/2021 (records in chart.) Has utilized OCP's until today for PP BCM. Declines all STD screening. Nexplanon Consult complete. Tawny Hopping, RN

## 2021-02-28 NOTE — Progress Notes (Signed)
S:  in clinic for Nexplanon insertion. Patient desires Nexplanon         for contraception, was on Micronor until [facility] could order device and get approval through insurance.    Patient  Has PP exam on 02/22/21 @ [facility]  Patient is [redacted]w[redacted]d pp today   O: PT result is negative   A:  1. Nexplanon insertion - Pregnancy, urine - etonogestrel (NEXPLANON) implant 68 mg   P:  Nexplanon Insertion Procedure Patient identified, informed consent performed, consent signed.   Patient does understand that irregular bleeding is a very common side effect of this medication. She was advised to have backup contraception after placement. Patient was determined to meet WHO criteria for not being pregnant. Appropriate time out taken.  The insertion site was identified 8-10 cm (3-4 inches) from the medial epicondyle of the humerus and 3-5 cm (1.25-2 inches) posterior to (below) the sulcus (groove) between the biceps and triceps muscles of the patient's letter  arm and marked.  Patient was prepped with alcohol swab and then injected with 3 ml of 1% lidocaine.  Arm was prepped with chlorhexidene, Nexplanon removed from packaging,  Device confirmed in needle, then inserted full length of needle and withdrawn per handbook instructions. Nexplanon was able to palpated in the patient's arm; patient palpated the insert herself. There was minimal blood loss.  Patient insertion site covered with guaze and a pressure bandage to reduce any bruising.  The patient tolerated the procedure well and was given post procedure instructions.   Nexplanon:   Counseled patient to take OTC analgesic starting as soon as lidocaine starts to wear off and take regularly for at least 48 hr to decrease discomfort.  Specifically to take with food or milk to decrease stomach upset and for IB 600 mg (3 tablets) every 6 hrs; IB 800 mg (4 tablets) every 8 hrs; or Aleve 2 tablets every 12 hrs.   Wendi Snipes, FNP

## 2021-05-19 DIAGNOSIS — Z20822 Contact with and (suspected) exposure to covid-19: Secondary | ICD-10-CM | POA: Diagnosis not present

## 2021-06-11 DIAGNOSIS — L02415 Cutaneous abscess of right lower limb: Secondary | ICD-10-CM | POA: Diagnosis not present

## 2021-06-11 DIAGNOSIS — L539 Erythematous condition, unspecified: Secondary | ICD-10-CM | POA: Diagnosis not present

## 2021-11-23 ENCOUNTER — Other Ambulatory Visit: Payer: Self-pay

## 2021-11-23 ENCOUNTER — Ambulatory Visit (INDEPENDENT_AMBULATORY_CARE_PROVIDER_SITE_OTHER): Payer: Medicaid Other | Admitting: Family Medicine

## 2021-11-23 ENCOUNTER — Encounter: Payer: Self-pay | Admitting: Family Medicine

## 2021-11-23 VITALS — BP 124/82 | HR 106 | Ht 59.0 in | Wt 217.0 lb

## 2021-11-23 DIAGNOSIS — R030 Elevated blood-pressure reading, without diagnosis of hypertension: Secondary | ICD-10-CM

## 2021-11-23 DIAGNOSIS — L700 Acne vulgaris: Secondary | ICD-10-CM | POA: Insufficient documentation

## 2021-11-23 HISTORY — DX: Acne vulgaris: L70.0

## 2021-11-23 NOTE — Assessment & Plan Note (Addendum)
Longstanding issue ongoing for years, has tried various OTC regimens. Involvement primarily at the cheeks, appears pustolonodular with faintly erythematous base. From a management standpoint, strategies were reviewed including OTC formulations and she is amenable to see dermatology. A referral was placed in that regard today.

## 2021-11-23 NOTE — Progress Notes (Signed)
°  ° °  Primary Care / Sports Medicine Office Visit  Patient Information:  Patient ID: Samantha Cabrera, female DOB: [birth year] Age: 21 y.o. MRN: [id number]   Samantha Cabrera is a pleasant 20 y.o. female presenting with the following:  Chief Complaint  Patient presents with   New Patient (Initial Visit)   Establish Care    Vitals:   11/23/21 1334 11/23/21 1407  BP: (!) 130/98 124/82  Pulse: (!) 106   SpO2: 98%    Vitals:   11/23/21 1334  Weight: 217 lb (98.4 kg)  Height: 4\' 11"  (1.499 m)   Body mass index is 43.83 kg/m.  No results found.   Independent interpretation of notes and tests performed by another provider:   None  Procedures performed:   None  Pertinent History, Exam, Impression, and Recommendations:   Elevated blood pressure reading in office without diagnosis of hypertension This was incidentally noted on establishment of care visit.  Recheck reading improved.  She denies any complaints from a cardiac standpoint.  Her examination reveals positive S1-S2, regular rate and rhythm, no additional heart sounds, clear lung fields without wheezes, rales, rhonchi.  She would benefit from risk stratification labs, will plan a follow-up for her annual physical and order labs at that time.  Over the interim, dietary measures provided.  Acne vulgaris Longstanding issue ongoing for years, has tried various OTC regimens. Involvement primarily at the cheeks, appears pustolonodular with faintly erythematous base. From a management standpoint, strategies were reviewed including OTC formulations and she is amenable to see dermatology. A referral was placed in that regard today.   Orders & Medications No orders of the defined types were placed in this encounter.  Orders Placed This Encounter  Procedures   Ambulatory referral to Dermatology     Return in about 4 weeks (around 12/21/2021) for annual exam physical.     12/23/2021, MD   Primary Care Sports  Medicine [facility]

## 2021-11-23 NOTE — Assessment & Plan Note (Signed)
This was incidentally noted on establishment of care visit.  Recheck reading improved.  She denies any complaints from a cardiac standpoint.  Her examination reveals positive S1-S2, regular rate and rhythm, no additional heart sounds, clear lung fields without wheezes, rales, rhonchi.  She would benefit from risk stratification labs, will plan a follow-up for her annual physical and order labs at that time.  Over the interim, dietary measures provided.

## 2021-11-23 NOTE — Patient Instructions (Addendum)
-   Referral coordinator will contact you in regards to follow-up with dermatology - Review information on healthy eating - Return for follow-up in 4 weeks for annual physical

## 2021-12-19 ENCOUNTER — Encounter: Payer: Self-pay | Admitting: Family Medicine

## 2021-12-21 ENCOUNTER — Encounter: Payer: Self-pay | Admitting: Family Medicine

## 2021-12-21 ENCOUNTER — Other Ambulatory Visit: Payer: Self-pay

## 2021-12-21 ENCOUNTER — Ambulatory Visit (INDEPENDENT_AMBULATORY_CARE_PROVIDER_SITE_OTHER): Payer: Medicaid Other | Admitting: Family Medicine

## 2021-12-21 VITALS — BP 122/74 | HR 98 | Ht 59.0 in | Wt 221.0 lb

## 2021-12-21 DIAGNOSIS — Z0000 Encounter for general adult medical examination without abnormal findings: Secondary | ICD-10-CM | POA: Insufficient documentation

## 2021-12-21 DIAGNOSIS — Z1159 Encounter for screening for other viral diseases: Secondary | ICD-10-CM | POA: Diagnosis not present

## 2021-12-21 DIAGNOSIS — Z13220 Encounter for screening for lipoid disorders: Secondary | ICD-10-CM

## 2021-12-21 DIAGNOSIS — L929 Granulomatous disorder of the skin and subcutaneous tissue, unspecified: Secondary | ICD-10-CM

## 2021-12-21 DIAGNOSIS — R7989 Other specified abnormal findings of blood chemistry: Secondary | ICD-10-CM

## 2021-12-21 HISTORY — DX: Granulomatous disorder of the skin and subcutaneous tissue, unspecified: L92.9

## 2021-12-21 MED ORDER — CLOBETASOL PROPIONATE 0.05 % EX CREA: 1.0000 "application " | TOPICAL_CREAM | Freq: Every day | CUTANEOUS | 0 refills | Status: DC

## 2021-12-21 NOTE — Assessment & Plan Note (Signed)
Patient had left nostril pierced roughly 2 months prior, over the past week she began to note swelling and irritation about the initial nose stud that was placed.  Denies any drainage of purulent fluids or otherwise, has been associated with pruritus, she did find that the nose stud was frequently moving and switch to a nose ring a few days prior, no change in the area of swelling.  She does give a history of this occurring to her left ear after a piercing, resolution only noted after removing the ring.  See exam, findings most consistent with a granuloma, I did encourage the patient to remove the nose ring but she is not amenable to this at this stage, can trial 1-2 weeks of scant amount of steroid, I did caution about skin thinning and hypopigmentation.  If ineffective, she can touch base with dermatology about this issue.  She will be seeing them for acne evaluation and annual skin check.
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
## 2021-12-21 NOTE — Patient Instructions (Addendum)
-   Obtain fasting labs with orders provided (can have water or black coffee but otherwise no food or drink x 8 hours before labs) - Referral coordinator will contact you in regards to scheduling visit with dermatology - Can trial topical Rx steroid sparingly - Review information provided - Attend eye doctor annually, dentist every 6 months, work towards or maintain 30 minutes of moderate intensity physical activity at least 5 days per week, and consume a balanced diet - Return in 1 year for physical - Contact us for any questions between now and then

## 2021-12-21 NOTE — Assessment & Plan Note (Signed)
Annual examination completed, risk stratification labs ordered, anticipatory guidance provided.  We will follow labs once resulted. BP returned to normal range.

## 2021-12-24 DIAGNOSIS — E783 Hyperchylomicronemia: Secondary | ICD-10-CM | POA: Diagnosis not present

## 2021-12-24 DIAGNOSIS — Z1329 Encounter for screening for other suspected endocrine disorder: Secondary | ICD-10-CM | POA: Diagnosis not present

## 2021-12-24 DIAGNOSIS — Z0000 Encounter for general adult medical examination without abnormal findings: Secondary | ICD-10-CM | POA: Diagnosis not present

## 2021-12-24 DIAGNOSIS — D72829 Elevated white blood cell count, unspecified: Secondary | ICD-10-CM | POA: Diagnosis not present

## 2021-12-24 DIAGNOSIS — R7989 Other specified abnormal findings of blood chemistry: Secondary | ICD-10-CM | POA: Diagnosis not present

## 2021-12-24 DIAGNOSIS — Z13220 Encounter for screening for lipoid disorders: Secondary | ICD-10-CM | POA: Diagnosis not present

## 2021-12-24 DIAGNOSIS — Z1159 Encounter for screening for other viral diseases: Secondary | ICD-10-CM | POA: Diagnosis not present

## 2021-12-25 LAB — COMPREHENSIVE METABOLIC PANEL
ALT: 23 IU/L (ref 0–32)
AST: 17 IU/L (ref 0–40)
Albumin/Globulin Ratio: 1.6 (ref 1.2–2.2)
Albumin: 4.7 g/dL (ref 3.9–5.0)
Alkaline Phosphatase: 134 IU/L — ABNORMAL HIGH (ref 42–106)
BUN/Creatinine Ratio: 17 (ref 9–23)
BUN: 14 mg/dL (ref 6–20)
Bilirubin Total: <0.2 mg/dL (ref 0.0–1.2)
CO2: 19 mmol/L — ABNORMAL LOW (ref 20–29)
Calcium: 9.6 mg/dL (ref 8.7–10.2)
Chloride: 104 mmol/L (ref 96–106)
Creatinine, Ser: 0.82 mg/dL (ref 0.57–1.00)
Globulin, Total: 3 g/dL (ref 1.5–4.5)
Glucose: 77 mg/dL (ref 70–99)
Potassium: 4.9 mmol/L (ref 3.5–5.2)
Sodium: 139 mmol/L (ref 134–144)
Total Protein: 7.7 g/dL (ref 6.0–8.5)
eGFR: 106 mL/min/{1.73_m2} (ref >59)

## 2021-12-25 LAB — CBC
Hematocrit: 37.1 % (ref 34.0–46.6)
Hemoglobin: 11.7 g/dL (ref 11.1–15.9)
MCH: 22.3 pg — ABNORMAL LOW (ref 26.6–33.0)
MCHC: 31.5 g/dL (ref 31.5–35.7)
MCV: 71 fL — ABNORMAL LOW (ref 79–97)
Platelets: 298 10*3/uL (ref 150–450)
RBC: 5.24 x10E6/uL (ref 3.77–5.28)
RDW: 15.8 % — ABNORMAL HIGH (ref 11.7–15.4)
WBC: 9.3 10*3/uL (ref 3.4–10.8)

## 2021-12-25 LAB — APO A1 + B + RATIO
Apolipo. B/A-1 Ratio: 0.7 ratio — ABNORMAL HIGH (ref 0.0–0.6)
Apolipoprotein A-1: 110 mg/dL — ABNORMAL LOW (ref 116–209)
Apolipoprotein B: 72 mg/dL (ref <90)

## 2021-12-25 LAB — LIPID PANEL
Chol/HDL Ratio: 4.3 ratio (ref 0.0–4.4)
Cholesterol, Total: 150 mg/dL (ref 100–169)
HDL: 35 mg/dL — ABNORMAL LOW (ref >39)
LDL Chol Calc (NIH): 84 mg/dL (ref 0–109)
Triglycerides: 177 mg/dL — ABNORMAL HIGH (ref 0–89)
VLDL Cholesterol Cal: 31 mg/dL (ref 5–40)

## 2021-12-25 LAB — TSH: TSH: 0.934 u[IU]/mL (ref 0.450–4.500)

## 2021-12-25 LAB — VITAMIN D 25 HYDROXY (VIT D DEFICIENCY, FRACTURES): Vit D, 25-Hydroxy: 23.3 ng/mL — ABNORMAL LOW (ref 30.0–100.0)

## 2021-12-25 LAB — HEPATITIS C ANTIBODY: Hep C Virus Ab: NONREACTIVE

## 2021-12-28 ENCOUNTER — Other Ambulatory Visit: Payer: Self-pay | Admitting: Family Medicine

## 2021-12-28 DIAGNOSIS — R7989 Other specified abnormal findings of blood chemistry: Secondary | ICD-10-CM

## 2021-12-28 MED ORDER — VITAMIN D (ERGOCALCIFEROL) 1.25 MG (50000 UNIT) PO CAPS: 50000.0000 [IU] | ORAL_CAPSULE | ORAL | 0 refills | Status: DC

## 2022-01-23 ENCOUNTER — Encounter: Payer: Self-pay | Admitting: Family Medicine

## 2022-02-24 ENCOUNTER — Encounter: Payer: Self-pay | Admitting: Family Medicine

## 2022-02-26 ENCOUNTER — Ambulatory Visit
Admission: RE | Admit: 2022-02-26 | Discharge: 2022-02-26 | Disposition: A | Discharge status: Home / Self Care | Payer: Medicaid Other | Attending: Family Medicine | Admitting: Family Medicine

## 2022-02-26 ENCOUNTER — Ambulatory Visit (INDEPENDENT_AMBULATORY_CARE_PROVIDER_SITE_OTHER): Payer: Medicaid Other | Admitting: Family Medicine

## 2022-02-26 ENCOUNTER — Encounter: Payer: Self-pay | Admitting: Family Medicine

## 2022-02-26 ENCOUNTER — Ambulatory Visit
Admission: RE | Admit: 2022-02-26 | Discharge: 2022-02-26 | Disposition: A | Discharge status: Home / Self Care | Payer: Medicaid Other | Source: Ambulatory Visit | Attending: Family Medicine | Admitting: Family Medicine

## 2022-02-26 VITALS — BP 128/82 | HR 78 | Ht 59.0 in | Wt 219.0 lb

## 2022-02-26 DIAGNOSIS — S638X1A Sprain of other part of right wrist and hand, initial encounter: Secondary | ICD-10-CM

## 2022-02-26 DIAGNOSIS — R7989 Other specified abnormal findings of blood chemistry: Secondary | ICD-10-CM

## 2022-02-26 DIAGNOSIS — M79641 Pain in right hand: Secondary | ICD-10-CM | POA: Diagnosis not present

## 2022-02-26 HISTORY — DX: Other specified abnormal findings of blood chemistry: R79.89

## 2022-02-26 HISTORY — DX: Sprain of other part of right wrist and hand, initial encounter: S63.8X1A

## 2022-02-26 MED ORDER — VITAMIN D (ERGOCALCIFEROL) 1.25 MG (50000 UNIT) PO CAPS: 50000.0000 [IU] | ORAL_CAPSULE | ORAL | 0 refills | Status: DC

## 2022-02-26 MED ORDER — MELOXICAM 15 MG PO TABS: 15.0000 mg | ORAL_TABLET | Freq: Every day | ORAL | 0 refills | Status: DC

## 2022-02-26 NOTE — Progress Notes (Signed)
?  ? ?  Primary Care / Sports Medicine Office Visit ? ?Patient Information:  ?Patient ID: Samantha Cabrera, female DOB: [birth year] Age: 21 y.o. MRN: [id number]  ? ?Samantha Cabrera is a pleasant 21 y.o. female presenting with the following: ? ?Chief Complaint  ?Patient presents with  ? Wrist Pain  ?  Started 1 week ago, no known injury. Thumb down to wrist. Pt is right handed  ? ? ?Vitals:  ? 02/26/22 1457  ?BP: 128/82  ?Pulse: 78  ?SpO2: 98%  ? ?Vitals:  ? 02/26/22 1457  ?Weight: 219 lb (99.3 kg)  ?Height: 4\' 11"  (1.499 m)  ? ?Body mass index is 44.23 kg/m?. ? ?No results found.  ? ?Independent interpretation of notes and tests performed by another provider:  ? ?Independent interpretation of right hand x-ray dated 02/26/2022 reveals maintained joint spaces throughout, no malalignment, no soft tissue abnormalities, no acute osseous processes noted ? ?Procedures performed:  ? ?None ? ?Pertinent History, Exam, Impression, and Recommendations:  ? ?Problem List Items Addressed This Visit   ? ?  ? Musculoskeletal and Integument  ? Sprain of carpometacarpal joint of right hand - Primary  ?  Right-hand-dominant patient with 1 week history of atraumatic right basal thumb pain with mild radiation into the MCP region, no swelling.  Uncertain about overuse as she is highly active with her young child at baseline.  Denies any paresthesias, nighttime pain, no treatments to date. ? ?Examination reveals intact sensorimotor function that is symmetric, full range of motion, negative Finkelstein, focal tenderness that recreates her stated symptomatology at the first [facility], mild secondary discomfort at the radial styloid. ? 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  I advised semirigid thumb spica, home-based exercises, and meloxicam course.  She can follow-up on as-needed basis for this issue. ? ?Independent interpretation x-rays, Rx  management ? ?  ?  ? Relevant Medications  ? meloxicam (MOBIC) 15 MG tablet  ? Other Relevant Orders  ? DG Hand Complete Right  ?  ? Other  ? Low serum vitamin D  ? Relevant Medications  ? Vitamin D, Ergocalciferol, (DRISDOL) 1.25 MG (50000 UNIT) CAPS capsule  ?  ? ?Orders & Medications ?Meds ordered this encounter  ?Medications  ? Vitamin D, Ergocalciferol, (DRISDOL) 1.25 MG (50000 UNIT) CAPS capsule  ?  Sig: Take 1 capsule (50,000 Units total) by mouth every 7 (seven) days. Take for 8 total doses(weeks)  ?  Dispense:  8 capsule  ?  Refill:  0  ? meloxicam (MOBIC) 15 MG tablet  ?  Sig: Take 1 tablet (15 mg total) by mouth daily.  ?  Dispense:  14 tablet  ?  Refill:  0  ? ?Orders Placed This Encounter  ?Procedures  ? DG Hand Complete Right  ?  ? ?Return if symptoms worsen or fail to improve.  ?  ? ?[facility], MD ? ? Primary Care Sports Medicine ?[facility] ?[facility]  ? ?

## 2022-02-26 NOTE — Assessment & Plan Note (Signed)
Right-hand-dominant patient with 1 week history of atraumatic right basal thumb pain with mild radiation into the MCP region, no swelling.  Uncertain about overuse as she is highly active with her young child at baseline.  Denies any paresthesias, nighttime pain, no treatments to date. ? ?Examination reveals intact sensorimotor function that is symmetric, full range of motion, negative Finkelstein, focal tenderness that recreates her stated symptomatology at the first [facility], mild secondary discomfort at the radial styloid. ? 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  I advised semirigid thumb spica, home-based exercises, and meloxicam course.  She can follow-up on as-needed basis for this issue. ? ?Independent interpretation x-rays, Rx management ?

## 2022-02-26 NOTE — Patient Instructions (Signed)
-   Dose meloxicam once daily x7 days (take with food) ?- Continue meloxicam beyond 7 days once daily on as-needed basis for thumb pain ?- Use thumb brace throughout the day, okay to remove for bathing, handwashing, sleeping, otherwise wear throughout all daily tasks ?- Start home exercises next week ?- Contact us for any lingering pain after 2 weeks ?- Follow-up as needed ?

## 2022-03-04 ENCOUNTER — Encounter: Payer: Self-pay | Admitting: Family Medicine

## 2022-03-25 ENCOUNTER — Encounter: Payer: Self-pay | Admitting: Family Medicine

## 2022-03-25 ENCOUNTER — Other Ambulatory Visit: Payer: Self-pay

## 2022-03-25 DIAGNOSIS — T782XXA Anaphylactic shock, unspecified, initial encounter: Secondary | ICD-10-CM

## 2022-03-25 MED ORDER — EPINEPHRINE 0.3 MG/0.3ML IJ SOAJ: 0.3000 mg | INTRAMUSCULAR | 3 refills | Status: DC | PRN

## 2022-03-25 NOTE — Telephone Encounter (Signed)
Please review.  KP

## 2022-03-27 NOTE — Telephone Encounter (Signed)
Please advise 

## 2022-03-29 ENCOUNTER — Encounter: Payer: Self-pay | Admitting: Family Medicine

## 2022-03-29 ENCOUNTER — Ambulatory Visit (INDEPENDENT_AMBULATORY_CARE_PROVIDER_SITE_OTHER): Payer: Medicaid Other | Admitting: Family Medicine

## 2022-03-29 VITALS — BP 122/80 | HR 78 | Ht 59.0 in | Wt 220.0 lb

## 2022-03-29 DIAGNOSIS — L03116 Cellulitis of left lower limb: Secondary | ICD-10-CM | POA: Insufficient documentation

## 2022-03-29 HISTORY — DX: Cellulitis of left lower limb: L03.116

## 2022-03-29 MED ORDER — HYDROXYZINE PAMOATE 25 MG PO CAPS: 25.0000 mg | ORAL_CAPSULE | Freq: Four times a day (QID) | ORAL | 0 refills | Status: DC | PRN

## 2022-03-29 MED ORDER — SULFAMETHOXAZOLE-TRIMETHOPRIM 800-160 MG PO TABS: 1.0000 | ORAL_TABLET | Freq: Two times a day (BID) | ORAL | 0 refills | Status: AC

## 2022-03-29 NOTE — Progress Notes (Signed)
     Primary Care / Sports Medicine Office Visit  Patient Information:  Patient ID: Samantha Cabrera, female DOB: 08/27/2001 Age: 21 y.o. MRN: [id number]   Samantha Cabrera is a pleasant 21 y.o. female presenting with the following:  Chief Complaint  Patient presents with   Foot Swelling    Was stung by bee on left big toe, did epi pen, still having swelling.     Vitals:   03/29/22 1003  BP: 122/80  Pulse: 78  SpO2: 99%   Vitals:   03/29/22 1003  Weight: 220 lb (99.8 kg)  Height: 4\' 11"  (1.499 m)   Body mass index is 44.43 kg/m.  No results found.   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  Her primary concern is the foot pruritus, denies any respiratory issues, or systemic symptoms beyond the initial exposure until she was able to utilize EpiPen.  Examination shows mild generalized swelling, faint erythema without clear demarcation about the left lateral great toe extending into the interdigit space to the dorsum of the forefoot, separate abrasion noted superficially which she says is secondary to her scratching at the area, pulses are readily palpable, no pitting edema.  I have advised patient to start Neosporin, topical antihistamine throughout the day and oral antihistamine at night, breakthrough pruritus to be addressed with Vistaril which was prescribed, she can initiate antibiotics if symptoms persist without any improvement in 3 days.  She can follow-up as needed.        Relevant Medications   sulfamethoxazole-trimethoprim (BACTRIM DS) 800-160 MG tablet   hydrOXYzine (VISTARIL) 25 MG capsule     Orders & Medications Meds ordered this encounter  Medications   sulfamethoxazole-trimethoprim (BACTRIM DS) 800-160 MG tablet    Sig: Take 1 tablet by mouth 2 (two) times daily for 5 days.    Dispense:  10 tablet    Refill:  0   hydrOXYzine (VISTARIL) 25 MG capsule    Sig: Take 1 capsule (25 mg total) by mouth every 6 (six) hours as needed for itching.    Dispense:  30 capsule    Refill:  0   No orders of the defined types were placed in this encounter.    No follow-ups on file.     6/22, MD   Primary Care Sports Medicine [facility]

## 2022-03-29 NOTE — Patient Instructions (Signed)
-   Use topical diphenhydramine (topical Benadryl) throughout the day until symptoms resolve - Use oral Benadryl prior to bedtime - Use Neosporin at stinger site and top of foot where skin open until skin fully heals - Can use hydroxyzine (Vistaril) on as-needed basis for itching - Start antibiotics on Monday if symptoms fail to show any progress - Contact for questions and follow-up as needed

## 2022-03-29 NOTE — Assessment & Plan Note (Signed)
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  Her primary concern is the foot pruritus, denies any respiratory issues, or systemic symptoms beyond the initial exposure until she was able to utilize EpiPen.  Examination shows mild generalized swelling, faint erythema without clear demarcation about the left lateral great toe extending into the interdigit space to the dorsum of the forefoot, separate abrasion noted superficially which she says is secondary to her scratching at the area, pulses are readily palpable, no pitting edema.  I have advised patient to start Neosporin, topical antihistamine throughout the day and oral antihistamine at night, breakthrough pruritus to be addressed with Vistaril which was prescribed, she can initiate antibiotics if symptoms persist without any improvement in 3 days.  She can follow-up as needed.

## 2022-05-15 ENCOUNTER — Ambulatory Visit (INDEPENDENT_AMBULATORY_CARE_PROVIDER_SITE_OTHER): Payer: Medicaid Other | Admitting: Dermatology

## 2022-05-15 DIAGNOSIS — L700 Acne vulgaris: Secondary | ICD-10-CM | POA: Diagnosis not present

## 2022-05-15 MED ORDER — TRETINOIN 0.025 % EX CREA: TOPICAL_CREAM | Freq: Every day | CUTANEOUS | 3 refills | Status: DC

## 2022-05-15 MED ORDER — DOXYCYCLINE MONOHYDRATE 50 MG PO TABS: 50.0000 mg | ORAL_TABLET | Freq: Every day | ORAL | 4 refills | Status: DC

## 2022-05-15 NOTE — Progress Notes (Signed)
   New Patient Visit  Subjective  Samantha Cabrera is a 21 y.o. female who presents for the following: Acne (Face, 2 yrs, otc tinted acne serum, cerave face wash).  New patient referral from Dr. Joseph Berkshire.  The following portions of the chart were reviewed this encounter and updated as appropriate:   Tobacco  Allergies  Meds  Problems  Med Hx  Surg Hx  Fam Hx     Review of Systems:  No other skin or systemic complaints except as noted in HPI or Assessment and Plan.  Objective  Well appearing patient in no apparent distress; mood and affect are within normal limits.  A focused examination was performed including face. Relevant physical exam findings are noted in the Assessment and Plan.  face Paps and pink macules mostly perioral area   Assessment & Plan  Acne vulgaris face  Start Doxycycline 50mg  1 po qd in evening meal Start Tretinoin 0.025% cre qhs to perioral area  Chronic and persistent condition with duration or expected duration over one year. Condition is symptomatic / bothersome to patient. Not to goal.  Doxycycline should be taken with food to prevent nausea. Do not lay down for 30 minutes after taking. Be cautious with sun exposure and use good sun protection while on this medication. Pregnant women should not take this medication.    Topical retinoid medications like tretinoin/Retin-A, adapalene/Differin, tazarotene/Fabior, and Epiduo/Epiduo Forte can cause dryness and irritation when first started. Only apply a pea-sized amount to the entire affected area. Avoid applying it around the eyes, edges of mouth and creases at the nose. If you experience irritation, use a good moisturizer first and/or apply the medicine less often. If you are doing well with the medicine, you can increase how often you use it until you are applying every night. Be careful with sun protection while using this medication as it can make you sensitive to the sun. This medicine should not  be used by pregnant women.    doxycycline (ADOXA) 50 MG tablet - face Take 1 tablet (50 mg total) by mouth daily. 1 po qd with evening meal  tretinoin (RETIN-A) 0.025 % cream - face Apply topically at bedtime. Qhs to face for acne  Return in about 3 months (around 08/15/2022) for Acne f/u.  I, 10/15/2022, RMA, am acting as scribe for Ardis Rowan, MD . Documentation: I have reviewed the above documentation for accuracy and completeness, and I agree with the above.  Armida Sans, MD

## 2022-05-15 NOTE — Patient Instructions (Addendum)
Acne face Start Doxycycline 50mg  1 pill a day with evening meal Start Tretinoin  cream nightly at bedtime  Doxycycline should be taken with food to prevent nausea. Do not lay down for 30 minutes after taking. Be cautious with sun exposure and use good sun protection while on this medication. Pregnant women should not take this medication.    Topical retinoid medications like tretinoin/Retin-A, adapalene/Differin, tazarotene/Fabior, and Epiduo/Epiduo Forte can cause dryness and irritation when first started. Only apply a pea-sized amount to the entire affected area. Avoid applying it around the eyes, edges of mouth and creases at the nose. If you experience irritation, use a good moisturizer first and/or apply the medicine less often. If you are doing well with the medicine, you can increase how often you use it until you are applying every night. Be careful with sun protection while using this medication as it can make you sensitive to the sun. This medicine should not be used by pregnant women.       Due to recent changes in healthcare laws, you may see results of your pathology and/or laboratory studies on MyChart before the doctors have had a chance to review them. We understand that in some cases there may be results that are confusing or concerning to you. Please understand that not all results are received at the same time and often the doctors may need to interpret multiple results in order to provide you with the best plan of care or course of treatment. Therefore, we ask that you please give 2 business days to thoroughly review all your results before contacting the office for clarification. Should we see a critical lab result, you will be contacted sooner.   If You Need Anything After Your Visit  If you have any questions or concerns for your doctor, please call our main line at [phone] and press option 4 to reach your doctor's medical assistant. If no one answers, please leave a  voicemail as directed and we will return your call as soon as possible. Messages left after 4 pm will be answered the following business day.   You may also send [phone] a message via MyChart. We typically respond to MyChart messages within 1-2 business days.  For prescription refills, please ask your pharmacy to contact our office. Our fax number is [phone].  If you have an urgent issue when the clinic is closed that cannot wait until the next business day, you can page your doctor at the number below.    Please note that while we do our best to be available for urgent issues outside of office hours, we are not available 24/7.   If you have an urgent issue and are unable to reach [phone], you may choose to seek medical care at your doctor's office, retail clinic, urgent care center, or emergency room.  If you have a medical emergency, please immediately call 911 or go to the emergency department.  Pager Numbers  - Dr. [place]: [phone]  - Dr. [phone]: [phone]  - Dr. [phone]: [phone]  In the event of inclement weather, please call our main line at [phone] for an update on the status of any delays or closures.  Dermatology Medication Tips: Please keep the boxes that topical medications come in in order to help keep track of the instructions about where and how to use these. Pharmacies typically print the medication instructions only on the boxes and not directly on the medication tubes.   If your medication is  too expensive, please contact our office at [phone] option 4 or send [place] a message through MyChart.   We are unable to tell what your co-pay for medications will be in advance as this is different depending on your insurance coverage. However, we may be able to find a substitute medication at lower cost or fill out paperwork to get insurance to cover a needed medication.   If a prior authorization is required to get your medication covered by your insurance  company, please allow [place] 1-2 business days to complete this process.  Drug prices often vary depending on where the prescription is filled and some pharmacies may offer cheaper prices.  The website www.goodrx.com contains coupons for medications through different pharmacies. The prices here do not account for what the cost may be with help from insurance (it may be cheaper with your insurance), but the website can give you the price if you did not use any insurance.  - You can print the associated coupon and take it with your prescription to the pharmacy.  - You may also stop by our office during regular business hours and pick up a GoodRx coupon card.  - If you need your prescription sent electronically to a different pharmacy, notify our office through [facility] or by phone at [phone] option 4.     Si Usted Necesita Algo Despus de Su Visita  Tambin puede enviarnos un mensaje a travs de Clinical cytogeneticist. Por lo general respondemos a los mensajes de MyChart en el transcurso de 1 a 2 das hbiles.  Para renovar recetas, por favor pida a su farmacia que se ponga en contacto con nuestra oficina. Annie Sable de fax es [place] [phone].  Si tiene un asunto urgente cuando la clnica est cerrada y que no puede esperar hasta el siguiente da hbil, puede llamar/localizar a su doctor(a) al nmero que aparece a continuacin.   Por favor, tenga en cuenta que aunque hacemos todo lo posible para estar disponibles para asuntos urgentes fuera del horario de [place], no estamos disponibles las 24 horas del da, los 7 [address]  [address] de la [place].   Si tiene un problema urgente y no puede comunicarse con nosotros, puede optar por buscar atencin mdica  en el consultorio de su doctor(a), en una clnica privada, en un centro de atencin urgente o en una sala de emergencias.  Si tiene Engineer, drilling, por favor llame inmediatamente al 911 o vaya a la sala de emergencias.  Nmeros de bper  - Dr.  Gwen Pounds: [phone]  - Dra. Moye: [phone]  - Dra. Roseanne Reno: [phone]  En caso de inclemencias del [place], por favor llame a Lacy Duverney principal al [phone] para una actualizacin sobre el [place] de cualquier retraso o cierre.  Consejos para la medicacin en dermatologa: Por favor, guarde las cajas en las que vienen los medicamentos de uso tpico para ayudarle a seguir las instrucciones sobre dnde y cmo usarlos. Las farmacias generalmente imprimen las instrucciones del medicamento slo en las cajas y no directamente en los tubos del [place].   Si su medicamento es muy caro, por favor, pngase en contacto con Rolm [place] llamando al [phone] y presione la opcin 4 o envenos un mensaje a travs de Clinical cytogeneticist.   No podemos decirle cul ser su copago por los medicamentos por adelantado ya que esto es diferente dependiendo de la cobertura de su seguro. Sin embargo, es posible que podamos encontrar un medicamento sustituto a Audiological scientist un formulario para que el  seguro cubra el medicamento que se considera necesario.   Si se requiere una autorizacin previa para que su compaa de seguros [place] su medicamento, por favor permtanos de 1 a 2 das hbiles para completar este proceso.  Los precios de los medicamentos varan con frecuencia dependiendo del Environmental consultant de dnde se surte la receta y alguna farmacias pueden ofrecer precios ms baratos.  El sitio web www.goodrx.com tiene cupones para medicamentos de Airline pilot. Los precios aqu no tienen en cuenta lo que podra costar con la ayuda del seguro (puede ser ms barato con su seguro), pero el sitio web puede darle el precio si no utiliz Research scientist (physical sciences).  - Puede imprimir el cupn correspondiente y llevarlo con su receta a la farmacia.  - Tambin puede pasar por nuestra oficina durante el horario de atencin regular y Charity fundraiser una tarjeta de cupones de GoodRx.  - Si necesita que su receta se enve  electrnicamente a una farmacia diferente, informe a nuestra oficina a travs de MyChart de [place] o por telfono llamando al [phone] y presione la opcin 4.

## 2022-05-24 ENCOUNTER — Encounter: Payer: Self-pay | Admitting: Dermatology

## 2022-08-15 ENCOUNTER — Ambulatory Visit: Payer: Medicaid Other | Admitting: Dermatology

## 2022-12-03 ENCOUNTER — Encounter: Payer: Self-pay | Admitting: Family Medicine

## 2022-12-03 ENCOUNTER — Ambulatory Visit (INDEPENDENT_AMBULATORY_CARE_PROVIDER_SITE_OTHER): Payer: Medicaid Other | Admitting: Family Medicine

## 2022-12-03 VITALS — BP 118/80 | HR 100 | Ht 59.0 in | Wt 221.0 lb

## 2022-12-03 DIAGNOSIS — R0602 Shortness of breath: Secondary | ICD-10-CM

## 2022-12-03 DIAGNOSIS — R058 Other specified cough: Secondary | ICD-10-CM | POA: Diagnosis not present

## 2022-12-03 DIAGNOSIS — U071 COVID-19: Secondary | ICD-10-CM

## 2022-12-03 DIAGNOSIS — E66813 Obesity, class 3: Secondary | ICD-10-CM | POA: Insufficient documentation

## 2022-12-03 DIAGNOSIS — Z6841 Body Mass Index (BMI) 40.0 and over, adult: Secondary | ICD-10-CM | POA: Diagnosis not present

## 2022-12-03 DIAGNOSIS — Z720 Tobacco use: Secondary | ICD-10-CM

## 2022-12-03 HISTORY — DX: COVID-19: U07.1

## 2022-12-03 LAB — POC COVID19 BINAXNOW: SARS Coronavirus 2 Ag: POSITIVE — AB

## 2022-12-03 MED ORDER — ALBUTEROL SULFATE HFA 108 (90 BASE) MCG/ACT IN AERS: 2.0000 | INHALATION_SPRAY | Freq: Four times a day (QID) | RESPIRATORY_TRACT | 0 refills | Status: DC | PRN

## 2022-12-03 MED ORDER — PROMETHAZINE-DM 6.25-15 MG/5ML PO SYRP: 5.0000 mL | ORAL_SOLUTION | Freq: Four times a day (QID) | ORAL | 0 refills | Status: DC | PRN

## 2022-12-03 NOTE — Patient Instructions (Signed)
-  Start Mucinex (guaifenesin) twice daily x 7 days then as needed - Drink plenty of fluids, get plenty of rest, and consider supplementation with the following: Vitamin C, vitamin D, zinc - Can use albuterol inhaler as needed for shortness of breath - For any worsening or rebound symptoms, contact our office for next steps - Return for annual physical in March

## 2022-12-03 NOTE — Progress Notes (Signed)
     Primary Care / Sports Medicine Office Visit  Patient Information:  Patient ID: Samantha Cabrera, female DOB: [birth year] Age: 22 y.o. MRN: [id number]   Samantha Cabrera is a pleasant 22 y.o. female presenting with the following:  Chief Complaint  Patient presents with   Sore Throat    Dad, mom and daughter tested positive for Covid, pt started cough today, sore throat, nasal congestion started saturday    Vitals:   12/03/22 1103  BP: 118/80  Pulse: 100  SpO2: 99%   Vitals:   12/03/22 1103  Weight: 221 lb (100.2 kg)  Height: 4\' 11"  (1.499 m)   Body mass index is 44.64 kg/m.  No results found.   Independent interpretation of notes and tests performed by another provider:   None  Procedures performed:   None  Pertinent History, Exam, Impression, and Recommendations:   Samantha Cabrera was seen today for sore throat.  COVID-19 Assessment & Plan: Symptoms for 3-4 days primarily involving shortness of air, nonproductive cough, sore throat, and nasal congestion.  Multiple family members with positive COVID home test.  Examination today with erythematous turbinates, benign oropharynx, benign tympanic membranes and canals, symmetric air entry bilaterally without wheezes, rales, rhonchi, benign cardiac sounds.  Reassuring oxygen saturations and vitals otherwise.  COVID test positive point-of-care.  Given patient's comorbid medical history we reviewed the utility of antiviral therapy, will manage with supportive care, as needed albuterol trial, recommendation for OTC regimen, and Rx antitussive.  Follow-up guidelines discussed with patient as well as advised to refrain from vaping while symptomatic.  Orders: -     POC COVID-19 BinaxNow -     Promethazine-DM; Take 5 mLs by mouth 4 (four) times daily as needed for cough.  Dispense: 118 mL; Refill: 0 -     Albuterol Sulfate HFA; Inhale 2 puffs into the lungs every 6 (six) hours as needed for wheezing or shortness of breath.  Dispense:  8 g; Refill: 0  Tobacco abuse Assessment & Plan: Discussion regarding pharmacologic and nonpharmacologic management strategies for cessation, will consider this and reach out to [place] in the future.   Obesity, Class III, BMI 40-49.9 (morbid obesity) (HCC)     Orders & Medications Meds ordered this encounter  Medications   promethazine-dextromethorphan (PROMETHAZINE-DM) 6.25-15 MG/5ML syrup    Sig: Take 5 mLs by mouth 4 (four) times daily as needed for cough.    Dispense:  118 mL    Refill:  0   albuterol (VENTOLIN HFA) 108 (90 Base) MCG/ACT inhaler    Sig: Inhale 2 puffs into the lungs every 6 (six) hours as needed for wheezing or shortness of breath.    Dispense:  8 g    Refill:  0   Orders Placed This Encounter  Procedures   POC COVID-19     Return for CPE.     Montel Culver, MD, [facility]   Primary Care Sports Medicine Primary Care and Sports Medicine at [facility]

## 2022-12-03 NOTE — Assessment & Plan Note (Signed)
Discussion regarding pharmacologic and nonpharmacologic management strategies for cessation, will consider this and reach out to [place] in the future.

## 2022-12-03 NOTE — Assessment & Plan Note (Signed)
Symptoms for 3-4 days primarily involving shortness of air, nonproductive cough, sore throat, and nasal congestion.  Multiple family members with positive COVID home test.  Examination today with erythematous turbinates, benign oropharynx, benign tympanic membranes and canals, symmetric air entry bilaterally without wheezes, rales, rhonchi, benign cardiac sounds.  Reassuring oxygen saturations and vitals otherwise.  COVID test positive point-of-care.  Given patient's comorbid medical history we reviewed the utility of antiviral therapy, will manage with supportive care, as needed albuterol trial, recommendation for OTC regimen, and Rx antitussive.  Follow-up guidelines discussed with patient as well as advised to refrain from vaping while symptomatic.

## 2023-01-02 ENCOUNTER — Encounter: Payer: Self-pay | Admitting: Family Medicine

## 2023-01-02 ENCOUNTER — Telehealth: Payer: Self-pay | Admitting: Family Medicine

## 2023-01-02 NOTE — Telephone Encounter (Signed)
Pt called to get medication for fever blister. Advised an appt was needed. Pt has CPE tomorrow afternoon. No sooner appts. Pt will keep CPE appt.

## 2023-01-02 NOTE — Telephone Encounter (Signed)
Noted  KP 

## 2023-01-02 NOTE — Telephone Encounter (Signed)
Please review.  KP

## 2023-01-03 ENCOUNTER — Encounter: Payer: Medicaid Other | Admitting: Family Medicine

## 2023-01-08 ENCOUNTER — Encounter: Payer: Self-pay | Admitting: Family Medicine

## 2023-01-28 ENCOUNTER — Encounter: Payer: Self-pay | Admitting: Family Medicine

## 2023-02-28 ENCOUNTER — Ambulatory Visit (INDEPENDENT_AMBULATORY_CARE_PROVIDER_SITE_OTHER): Payer: 59 | Admitting: Family Medicine

## 2023-02-28 ENCOUNTER — Encounter: Payer: Self-pay | Admitting: Family Medicine

## 2023-02-28 VITALS — BP 118/80 | HR 100 | Ht 59.0 in | Wt 222.0 lb

## 2023-02-28 DIAGNOSIS — J029 Acute pharyngitis, unspecified: Secondary | ICD-10-CM | POA: Diagnosis not present

## 2023-02-28 DIAGNOSIS — Z124 Encounter for screening for malignant neoplasm of cervix: Secondary | ICD-10-CM

## 2023-02-28 LAB — POCT RAPID STREP A (OFFICE): Rapid Strep A Screen: NEGATIVE

## 2023-02-28 MED ORDER — AZITHROMYCIN 250 MG PO TABS: ORAL_TABLET | ORAL | 0 refills | Status: AC

## 2023-02-28 NOTE — Assessment & Plan Note (Signed)
7-day history of severe throat pain, fatigue, ear pain, denies fevers, chills, shortness of air.  She brings up multiple episodes of recurrent throughout childhood.  Examination with frank erythema about the oropharynx, no visualized exudates, significant dark cerumen at the canals with mildly erythematous tympanic membranes bilaterally, tender lymphadenopathy bilateral cervical chains, clear lung fields benign cardiac sounds.  Rapid strep negative, given duration of symptoms and findings plan as follows: - Azithromycin course - Supportive care - Referral to ENT due to multiple recurrences, patient request for tonsillectomy evaluation

## 2023-02-28 NOTE — Progress Notes (Signed)
     Primary Care / Sports Medicine Office Visit  Patient Information:  Patient ID: Samantha Cabrera, female DOB: December 07, 2000 Age: 22 y.o. MRN: [id number]   Samantha Cabrera is a pleasant 22 y.o. female presenting with the following:  Chief Complaint  Patient presents with   Sore Throat    1 week    Vitals:   02/28/23 0842  BP: 118/80  Pulse: 100  SpO2: 98%   Vitals:   02/28/23 0842  Weight: 222 lb (100.7 kg)  Height: 4\' 11"  (1.499 m)   Body mass index is 44.84 kg/m.  No results found.   Independent interpretation of notes and tests performed by another provider:   None  Procedures performed:   None  Pertinent History, Exam, Impression, and Recommendations:   Samantha Cabrera was seen today for sore throat.  Pharyngitis, unspecified etiology Assessment & Plan: 7-day history of severe throat pain, fatigue, ear pain, denies fevers, chills, shortness of air.  She brings up multiple episodes of recurrent throughout childhood.  Examination with frank erythema about the oropharynx, no visualized exudates, significant dark cerumen at the canals with mildly erythematous tympanic membranes bilaterally, tender lymphadenopathy bilateral cervical chains, clear lung fields benign cardiac sounds.  Rapid strep negative, given duration of symptoms and findings plan as follows: - Azithromycin course - Supportive care - Referral to ENT due to multiple recurrences, patient request for tonsillectomy evaluation  Orders: -     Ambulatory referral to ENT -     POCT rapid strep A -     Azithromycin; Take 2 tablets on day 1, then 1 tablet daily on days 2 through 5  Dispense: 6 tablet; Refill: 0  Cervical cancer screening -     Ambulatory referral to Gynecology     Orders & Medications Meds ordered this encounter  Medications   azithromycin (ZITHROMAX) 250 MG tablet    Sig: Take 2 tablets on day 1, then 1 tablet daily on days 2 through 5    Dispense:  6 tablet    Refill:  0   Orders  Placed This Encounter  Procedures   Ambulatory referral to ENT   Ambulatory referral to Gynecology   POCT rapid strep A     Return in about 4 months (around 06/30/2023) for CPE.     Jerrol Banana, MD, [facility]   Primary Care Sports Medicine Primary Care and Sports Medicine at [facility]

## 2023-02-28 NOTE — Patient Instructions (Signed)
-   Take antibiotics for full course - Review information attached - Referral coordinator will contact you to schedule visits with ENT and GYN - Return in Aug for physical

## 2023-03-12 ENCOUNTER — Ambulatory Visit: Payer: 59

## 2023-03-17 DIAGNOSIS — Z111 Encounter for screening for respiratory tuberculosis: Secondary | ICD-10-CM | POA: Diagnosis not present

## 2023-03-20 ENCOUNTER — Emergency Department
Admission: EM | Admit: 2023-03-20 | Discharge: 2023-03-20 | Disposition: A | Discharge status: Home / Self Care | Payer: 59 | Attending: Emergency Medicine | Admitting: Emergency Medicine

## 2023-03-20 ENCOUNTER — Other Ambulatory Visit: Payer: Self-pay

## 2023-03-20 ENCOUNTER — Ambulatory Visit: Payer: 59 | Admitting: Family Medicine

## 2023-03-20 DIAGNOSIS — R000 Tachycardia, unspecified: Secondary | ICD-10-CM | POA: Insufficient documentation

## 2023-03-20 DIAGNOSIS — R918 Other nonspecific abnormal finding of lung field: Secondary | ICD-10-CM | POA: Diagnosis not present

## 2023-03-20 DIAGNOSIS — R590 Localized enlarged lymph nodes: Secondary | ICD-10-CM | POA: Insufficient documentation

## 2023-03-20 DIAGNOSIS — R509 Fever, unspecified: Secondary | ICD-10-CM | POA: Diagnosis not present

## 2023-03-20 DIAGNOSIS — R0981 Nasal congestion: Secondary | ICD-10-CM | POA: Insufficient documentation

## 2023-03-20 DIAGNOSIS — Z1152 Encounter for screening for COVID-19: Secondary | ICD-10-CM | POA: Insufficient documentation

## 2023-03-20 DIAGNOSIS — Z20822 Contact with and (suspected) exposure to covid-19: Secondary | ICD-10-CM | POA: Diagnosis not present

## 2023-03-20 DIAGNOSIS — N3090 Cystitis, unspecified without hematuria: Secondary | ICD-10-CM | POA: Diagnosis not present

## 2023-03-20 DIAGNOSIS — N760 Acute vaginitis: Secondary | ICD-10-CM | POA: Diagnosis not present

## 2023-03-20 LAB — SARS CORONAVIRUS 2 BY RT PCR: SARS Coronavirus 2 by RT PCR: NEGATIVE

## 2023-03-20 LAB — GROUP A STREP BY PCR: Group A Strep by PCR: NOT DETECTED

## 2023-03-20 MED ORDER — ACETAMINOPHEN 325 MG PO TABS
650.0000 mg | ORAL_TABLET | Freq: Once | ORAL | Status: AC
  Administered 2023-03-20: 650 mg via ORAL
  Filled 2023-03-20: qty 2

## 2023-03-20 NOTE — ED Provider Notes (Signed)
[facility] Provider Note  Patient Contact: 7:58 PM (approximate)   History   Fever   HPI  Samantha Cabrera is a 22 y.o. female presents to the emergency department with nasal congestion, low-grade fever and tender cervical lymphadenopathy for the past 24 hours.  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  No vomiting or diarrhea.  No chest pain, chest tightness or abdominal pain.      Physical Exam   Triage Vital Signs: ED Triage Vitals  Enc Vitals Group     BP 03/20/23 1852 138/86     Pulse Rate 03/20/23 1852 (!) 115     Resp 03/20/23 1852 19     Temp 03/20/23 1852 (!) 100.4 F (38 C)     Temp src --      SpO2 03/20/23 1852 96 %     Weight --      Height --      Head Circumference --      Peak Flow --      Pain Score 03/20/23 1850 5     Pain Loc --      Pain Edu? --      Excl. in GC? --     Most recent vital signs: Vitals:   03/20/23 1852  BP: 138/86  Pulse: (!) 115  Resp: 19  Temp: (!) 100.4 F (38 C)  SpO2: 96%     Constitutional: Alert and oriented. Patient is lying supine. Eyes: Conjunctivae are normal. PERRL. EOMI. Head: Atraumatic. ENT:      Ears: Tympanic membranes are mildly injected with mild effusion bilaterally.       Nose: No congestion/rhinnorhea.      Mouth/Throat: Mucous membranes are moist. Posterior pharynx is mildly erythematous.  Hematological/Lymphatic/Immunilogical: No cervical lymphadenopathy.  Cardiovascular: Normal rate, regular rhythm. Normal S1 and S2.  Good peripheral circulation. Respiratory: Normal respiratory effort without tachypnea or retractions. Lungs CTAB. Good air entry to the bases with no decreased or absent breath sounds. Gastrointestinal: Bowel sounds 4 quadrants. Soft and nontender to palpation. No guarding or rigidity. No palpable masses. No distention. No CVA tenderness. Musculoskeletal: Full range of motion to all extremities. No gross  deformities appreciated. Neurologic:  Normal speech and language. No gross focal neurologic deficits are appreciated.  Skin:  Skin is warm, dry and intact. No rash noted. Psychiatric: Mood and affect are normal. Speech and behavior are normal. Patient exhibits appropriate insight and judgement.   ED Results / Procedures / Treatments   Labs (all labs ordered are listed, but only abnormal results are displayed) Labs Reviewed  SARS CORONAVIRUS 2 BY RT PCR  GROUP A STREP BY PCR       PROCEDURES:  Critical Care performed: No  Procedures   MEDICATIONS ORDERED IN ED: Medications  acetaminophen (TYLENOL) tablet 650 mg (has no administration in time range)     IMPRESSION / MDM / ASSESSMENT AND PLAN / ED COURSE  I reviewed the triage vital signs and the nursing notes.                              Assessment and plan Viral URI 22 year old female presents to the emergency department with fever, nasal congestion and some tender anterior cervical lymph nodes.  Patient had low-grade fever and tachycardia at triage but vital signs were otherwise reassuring.  On exam, patient was alert and nontoxic-appearing.  Patient was told  that her COVID-19 test was negative.  She will wait group A strep testing at home.  Supportive measures were encouraged for home use.  Return precautions were given to return with new or worsening symptoms.      FINAL CLINICAL IMPRESSION(S) / ED DIAGNOSES   Final diagnoses:  Cervical lymphadenopathy     Rx / DC Orders   ED Discharge Orders     None        Note:  This document was prepared using Dragon voice recognition software and may include unintentional dictation errors.   Pia Mau [place], PA-C 03/20/23 Norm Salt, MD 03/26/23 [phone]

## 2023-03-20 NOTE — ED Triage Notes (Signed)
Pt comes with c/o body aches, fever and stuffy nose. Pt states this all started today.

## 2023-03-21 DIAGNOSIS — N3090 Cystitis, unspecified without hematuria: Secondary | ICD-10-CM | POA: Diagnosis not present

## 2023-03-21 DIAGNOSIS — R918 Other nonspecific abnormal finding of lung field: Secondary | ICD-10-CM | POA: Diagnosis not present

## 2023-03-21 DIAGNOSIS — N760 Acute vaginitis: Secondary | ICD-10-CM | POA: Diagnosis not present

## 2023-03-21 DIAGNOSIS — Z20822 Contact with and (suspected) exposure to covid-19: Secondary | ICD-10-CM | POA: Diagnosis not present

## 2023-03-21 DIAGNOSIS — R509 Fever, unspecified: Secondary | ICD-10-CM | POA: Diagnosis not present

## 2023-03-22 DIAGNOSIS — N3090 Cystitis, unspecified without hematuria: Secondary | ICD-10-CM | POA: Diagnosis not present

## 2023-03-25 ENCOUNTER — Telehealth: Payer: Self-pay

## 2023-03-25 ENCOUNTER — Ambulatory Visit
Admission: RE | Admit: 2023-03-25 | Discharge: 2023-03-25 | Disposition: A | Discharge status: Home / Self Care | Payer: Medicaid Other | Source: Ambulatory Visit | Attending: Family Medicine | Admitting: Family Medicine

## 2023-03-25 ENCOUNTER — Encounter: Payer: Self-pay | Admitting: Family Medicine

## 2023-03-25 ENCOUNTER — Ambulatory Visit (INDEPENDENT_AMBULATORY_CARE_PROVIDER_SITE_OTHER): Payer: 59 | Admitting: Family Medicine

## 2023-03-25 ENCOUNTER — Ambulatory Visit
Admission: RE | Admit: 2023-03-25 | Discharge: 2023-03-25 | Disposition: A | Discharge status: Home / Self Care | Payer: Medicaid Other | Attending: Family Medicine | Admitting: Family Medicine

## 2023-03-25 VITALS — BP 138/86 | HR 68 | Temp 99.7°F | Ht 59.0 in | Wt 222.0 lb

## 2023-03-25 DIAGNOSIS — J029 Acute pharyngitis, unspecified: Secondary | ICD-10-CM

## 2023-03-25 DIAGNOSIS — R109 Unspecified abdominal pain: Secondary | ICD-10-CM

## 2023-03-25 HISTORY — DX: Unspecified abdominal pain: R10.9

## 2023-03-25 LAB — POCT RAPID STREP A (OFFICE): Rapid Strep A Screen: NEGATIVE

## 2023-03-25 MED ORDER — AZITHROMYCIN 250 MG PO TABS
ORAL_TABLET | ORAL | 0 refills | Status: AC
Start: 2023-03-25 — End: 2023-03-30

## 2023-03-25 MED ORDER — METHYLPREDNISOLONE 4 MG PO TBPK: ORAL_TABLET | ORAL | 0 refills | Status: DC

## 2023-03-25 MED ORDER — NYSTATIN 100000 UNIT/ML MT SUSP
5.0000 mL | Freq: Three times a day (TID) | OROMUCOSAL | 0 refills | Status: AC | PRN
Start: 2023-03-25 — End: ?

## 2023-03-25 MED ORDER — FLUCONAZOLE 150 MG PO TABS: 150.0000 mg | ORAL_TABLET | Freq: Once | ORAL | 0 refills | Status: AC

## 2023-03-25 MED ORDER — PROMETHAZINE HCL 25 MG PO TABS: 25.0000 mg | ORAL_TABLET | Freq: Four times a day (QID) | ORAL | 0 refills | Status: DC | PRN

## 2023-03-25 NOTE — Patient Instructions (Signed)
-   Obtain labs and x-ray today - Start antibiotics, take for full course - Start steroids, take for full course - Can use mouthwash as needed for throat pain - Can use promethazine (antinausea) as needed - Can use alternating Tylenol (acetaminophen) and Advil (ibuprofen) for pain/fever - If yeast symptoms noted, start Diflucan - If any severe worsening symptoms noted, go to ER - Contact us for any questions, follow-up as needed

## 2023-03-25 NOTE — Transitions of Care (Post Inpatient/ED Visit) (Unsigned)
   03/25/2023  Name: AELYN REQUEJO MRN: [id number] DOB: 2001-09-18  Today's TOC FU Call Status: Today's TOC FU Call Status:: Successful TOC FU Call Competed  Transition Care Management Follow-up Telephone Call Date of Discharge: 03/21/23 Discharge Facility: [facility]) Type of Discharge: Emergency Department How have you been since you were released from the hospital?: Worse Any questions or concerns?: No  Items Reviewed: Did you receive and understand the discharge instructions provided?: No Medications obtained,verified, and reconciled?: Yes (Medications Reviewed) Any new allergies since your discharge?: No Dietary orders reviewed?: No Do you have support at home?: No  Medications Reviewed Today: Medications Reviewed Today     Reviewed by Jerl Santos, CMA (Certified Medical Assistant) on 02/28/23 at [phone]  Med List Status: <None>   Medication Order Taking? Sig Documenting Provider Last Dose Status Informant  albuterol (VENTOLIN HFA) 108 (90 Base) MCG/ACT inhaler [id number]  Inhale 2 puffs into the lungs every 6 (six) hours as needed for wheezing or shortness of breath. Jerrol Banana, MD  Active   EPINEPHrine 0.3 mg/0.3 mL IJ SOAJ injection [id number]  Inject 0.3 mg into the muscle as needed for anaphylaxis. Jerrol Banana, MD  Active   etonogestrel (NEXPLANON) 68 MG IMPL implant [id number]  1 each by Subdermal route once. 04/2021 [provider]  Active   promethazine-dextromethorphan (PROMETHAZINE-DM) 6.25-15 MG/5ML syrup [id number]  Take 5 mLs by mouth 4 (four) times daily as needed for cough. Jerrol Banana, MD  Active             Home Care and Equipment/Supplies: Were Home Health Services Ordered?: No Any new equipment or medical supplies ordered?: No  Functional Questionnaire: Do you need assistance with bathing/showering or dressing?: No Do you need assistance with meal preparation?: No Do you need assistance with  eating?: No Do you have difficulty maintaining continence: No Do you need assistance with getting out of bed/getting out of a chair/moving?: No Do you have difficulty managing or taking your medications?: No  Follow up appointments reviewed: PCP Follow-up appointment confirmed?: No MD Provider Line [phone] Given: No Specialist Hospital Follow-up appointment confirmed?: No Reason Specialist Follow-Up Not Confirmed: Appointment Sceduled by [facility] Calling Clinician Do you need transportation to your follow-up appointment?: No Do you understand care options if your condition(s) worsen?: Yes-patient verbalized understanding    SIGNATURE Izetta Dakin

## 2023-03-26 LAB — COMPREHENSIVE METABOLIC PANEL
ALT: 19 IU/L (ref 0–32)
AST: 17 IU/L (ref 0–40)
Albumin/Globulin Ratio: 1.5 (ref 1.2–2.2)
Albumin: 4.3 g/dL (ref 4.0–5.0)
Alkaline Phosphatase: 111 IU/L — ABNORMAL HIGH (ref 42–106)
BUN/Creatinine Ratio: 12 (ref 9–23)
BUN: 9 mg/dL (ref 6–20)
Bilirubin Total: 0.2 mg/dL (ref 0.0–1.2)
CO2: 23 mmol/L (ref 20–29)
Calcium: 8.8 mg/dL (ref 8.7–10.2)
Chloride: 101 mmol/L (ref 96–106)
Creatinine, Ser: 0.73 mg/dL (ref 0.57–1.00)
Globulin, Total: 2.9 g/dL (ref 1.5–4.5)
Glucose: 88 mg/dL (ref 70–99)
Potassium: 4.3 mmol/L (ref 3.5–5.2)
Sodium: 139 mmol/L (ref 134–144)
Total Protein: 7.2 g/dL (ref 6.0–8.5)
eGFR: 121 mL/min/{1.73_m2} (ref >59)

## 2023-03-26 LAB — CBC WITH DIFFERENTIAL/PLATELET
Basophils Absolute: 0.1 10*3/uL (ref 0.0–0.2)
Basos: 1 %
EOS (ABSOLUTE): 0.2 10*3/uL (ref 0.0–0.4)
Eos: 2 %
Hematocrit: 35.1 % (ref 34.0–46.6)
Hemoglobin: 11.5 g/dL (ref 11.1–15.9)
Immature Grans (Abs): 0.1 10*3/uL (ref 0.0–0.1)
Immature Granulocytes: 1 %
Lymphocytes Absolute: 1.9 10*3/uL (ref 0.7–3.1)
Lymphs: 18 %
MCH: 23.9 pg — ABNORMAL LOW (ref 26.6–33.0)
MCHC: 32.8 g/dL (ref 31.5–35.7)
MCV: 73 fL — ABNORMAL LOW (ref 79–97)
Monocytes Absolute: 0.9 10*3/uL (ref 0.1–0.9)
Monocytes: 9 %
Neutrophils Absolute: 7.4 10*3/uL — ABNORMAL HIGH (ref 1.4–7.0)
Neutrophils: 69 %
Platelets: 229 10*3/uL (ref 150–450)
RBC: 4.81 x10E6/uL (ref 3.77–5.28)
RDW: 14.6 % (ref 11.7–15.4)
WBC: 10.4 10*3/uL (ref 3.4–10.8)

## 2023-03-26 LAB — URINALYSIS
Bilirubin, UA: NEGATIVE
Glucose, UA: NEGATIVE
Nitrite, UA: POSITIVE — AB
RBC, UA: NEGATIVE
Specific Gravity, UA: 1.027 (ref 1.005–1.030)
Urobilinogen, Ur: 1 mg/dL (ref 0.2–1.0)
pH, UA: 6 (ref 5.0–7.5)

## 2023-03-26 LAB — MONONUCLEOSIS SCREEN: Mono Screen: NEGATIVE

## 2023-03-26 NOTE — Assessment & Plan Note (Signed)
6-7-day history of significant dysphagia, no significant cough, malaise, fatigue, subjective fevers.  Of note she has had 2 urgent care visits, instantly noted to have cystitis and BV.  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  Despite treatment for cystitis and BV continues to be symptomatic utilizing acetaminophen for symptom control.  Denies any sexual exposure x 3 months.  Exam with pharyngeal erythema, whitish exudate that does not fully scrape off, shotty scattered cervical lymphadenopathy, lung fields are clear, cardiac sounds benign.  Rapid strep negative.  Concern for viral pharyngitis including infectious mononucleosis related to patient however given the duration and severity of symptoms, plan as follows: - Azithromycin due to penicillin allergy - Medrol Dosepak - Magic mouthwash including nystatin - CBC, Monospot

## 2023-03-26 NOTE — Progress Notes (Signed)
Primary Care / Sports Medicine Office Visit  Patient Information:  Patient ID: Samantha Cabrera, female DOB: Sep 25, 2001 Age: 22 y.o. MRN: [id number]   Samantha Cabrera is a pleasant 22 y.o. female presenting with the following:  Chief Complaint  Patient presents with   Sore Throat    Vitals:   03/25/23 1552  BP: 138/86  Pulse: 68  Temp: 99.7 F (37.6 C)  SpO2: 97%   Vitals:   03/25/23 1552  Weight: 222 lb (100.7 kg)  Height: 4\' 11"  (1.499 m)   Body mass index is 44.84 kg/m.  No results found.   Independent interpretation of notes and tests performed by another provider:   None  Procedures performed:   None  Pertinent History, Exam, Impression, and Recommendations:   Samantha Cabrera was seen today for sore throat.  Pharyngitis, unspecified etiology Assessment & Plan: 6-7-day history of significant dysphagia, no significant cough, malaise, fatigue, subjective fevers.  Of note she has had 2 urgent care visits, instantly noted to have cystitis and BV.  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  Despite treatment for cystitis and BV continues to be symptomatic utilizing acetaminophen for symptom control.  Denies any sexual exposure x 3 months.  Exam with pharyngeal erythema, whitish exudate that does not fully scrape off, shotty scattered cervical lymphadenopathy, lung fields are clear, cardiac sounds benign.  Rapid strep negative.  Concern for viral pharyngitis including infectious mononucleosis related to patient however given the duration and severity of symptoms, plan as follows: - Azithromycin due to penicillin allergy - Medrol Dosepak - Magic mouthwash including nystatin - CBC, Monospot  Orders: -     Mononucleosis screen -     CBC with Differential/Platelet -     Comprehensive metabolic panel -     magic mouthwash (nystatin, lidocaine, diphenhydrAMINE, alum & mag hydroxide) suspension; Swish and swallow 5 mLs 3 (three) times daily as  needed for mouth pain.  Dispense: 180 mL; Refill: 0 -     Azithromycin; Take 2 tablets on day 1, then 1 tablet daily on days 2 through 5  Dispense: 6 tablet; Refill: 0 -     methylPREDNISolone; Take for full course per package instructions  Dispense: 21 tablet; Refill: 0  Flank pain Assessment & Plan: 4-5-day history of bilateral flank pain, denies dysuria but has noted dark-colored urine despite endorsing increased p.o. water intake following cystitis diagnosis days prior.  No additional bowel/bladder changes.  Describes colicky symptoms.  Exam with normoactive bowel sounds, abdomen soft, nontender, nondistended, positive CVA tenderness bilaterally, nontender suprapubic region.  Plan as follows: - Urinalysis, urine culture  - Finish out Macrobid and metronidazole previously prescribed by urgent care - KUB x-ray - Follow results and modify treatment accordingly  Orders: -     DG Abd 1 View; Future -     CBC with Differential/Platelet -     Comprehensive metabolic panel -     Urinalysis -     Urine Culture  Sore throat -     POCT rapid strep A  Other orders -     Fluconazole; Take 1 tablet (150 mg total) by mouth once for 1 dose.  Dispense: 1 tablet; Refill: 0 -     Promethazine HCl; Take 1 tablet (25 mg total) by mouth every 6 (six) hours as needed for nausea or vomiting.  Dispense: 30 tablet; Refill: 0     Orders & Medications Meds ordered this encounter  Medications  magic mouthwash (nystatin, lidocaine, diphenhydrAMINE, alum & mag hydroxide) suspension    Sig: Swish and swallow 5 mLs 3 (three) times daily as needed for mouth pain.    Dispense:  180 mL    Refill:  0   azithromycin (ZITHROMAX) 250 MG tablet    Sig: Take 2 tablets on day 1, then 1 tablet daily on days 2 through 5    Dispense:  6 tablet    Refill:  0   methylPREDNISolone (MEDROL DOSEPAK) 4 MG TBPK tablet    Sig: Take for full course per package instructions    Dispense:  21 tablet    Refill:  0    fluconazole (DIFLUCAN) 150 MG tablet    Sig: Take 1 tablet (150 mg total) by mouth once for 1 dose.    Dispense:  1 tablet    Refill:  0   promethazine (PHENERGAN) 25 MG tablet    Sig: Take 1 tablet (25 mg total) by mouth every 6 (six) hours as needed for nausea or vomiting.    Dispense:  30 tablet    Refill:  0   Orders Placed This Encounter  Procedures   Urine Culture   DG Abd 1 View   Monospot   CBC with Differential/Platelet   Comprehensive Metabolic Panel (CMET)   Urinalysis   POCT rapid strep A     No follow-ups on file.     Samantha Banana, MD, [facility]   Primary Care Sports Medicine Primary Care and Sports Medicine at [facility]

## 2023-03-26 NOTE — Assessment & Plan Note (Signed)
4-5-day history of bilateral flank pain, denies dysuria but has noted dark-colored urine despite endorsing increased p.o. water intake following cystitis diagnosis days prior.  No additional bowel/bladder changes.  Describes colicky symptoms.  Exam with normoactive bowel sounds, abdomen soft, nontender, nondistended, positive CVA tenderness bilaterally, nontender suprapubic region.  Plan as follows: - Urinalysis, urine culture  - Finish out Macrobid and metronidazole previously prescribed by urgent care - KUB x-ray - Follow results and modify treatment accordingly

## 2023-03-26 NOTE — Progress Notes (Signed)
Labs added.  KP 

## 2023-03-31 LAB — URINE CULTURE

## 2023-04-23 ENCOUNTER — Encounter: Payer: 59 | Admitting: Family Medicine

## 2023-04-24 ENCOUNTER — Encounter: Payer: Self-pay | Admitting: Family Medicine

## 2023-07-11 IMAGING — CR DG HAND COMPLETE 3+V*R*
3 series · 3 of 3 positions shown · non-contrast
Comparison: None.

CLINICAL DATA: Acute right hand 1st CMC pain.

EXAM:
RIGHT HAND - COMPLETE 3+ VIEW

[hand ap]
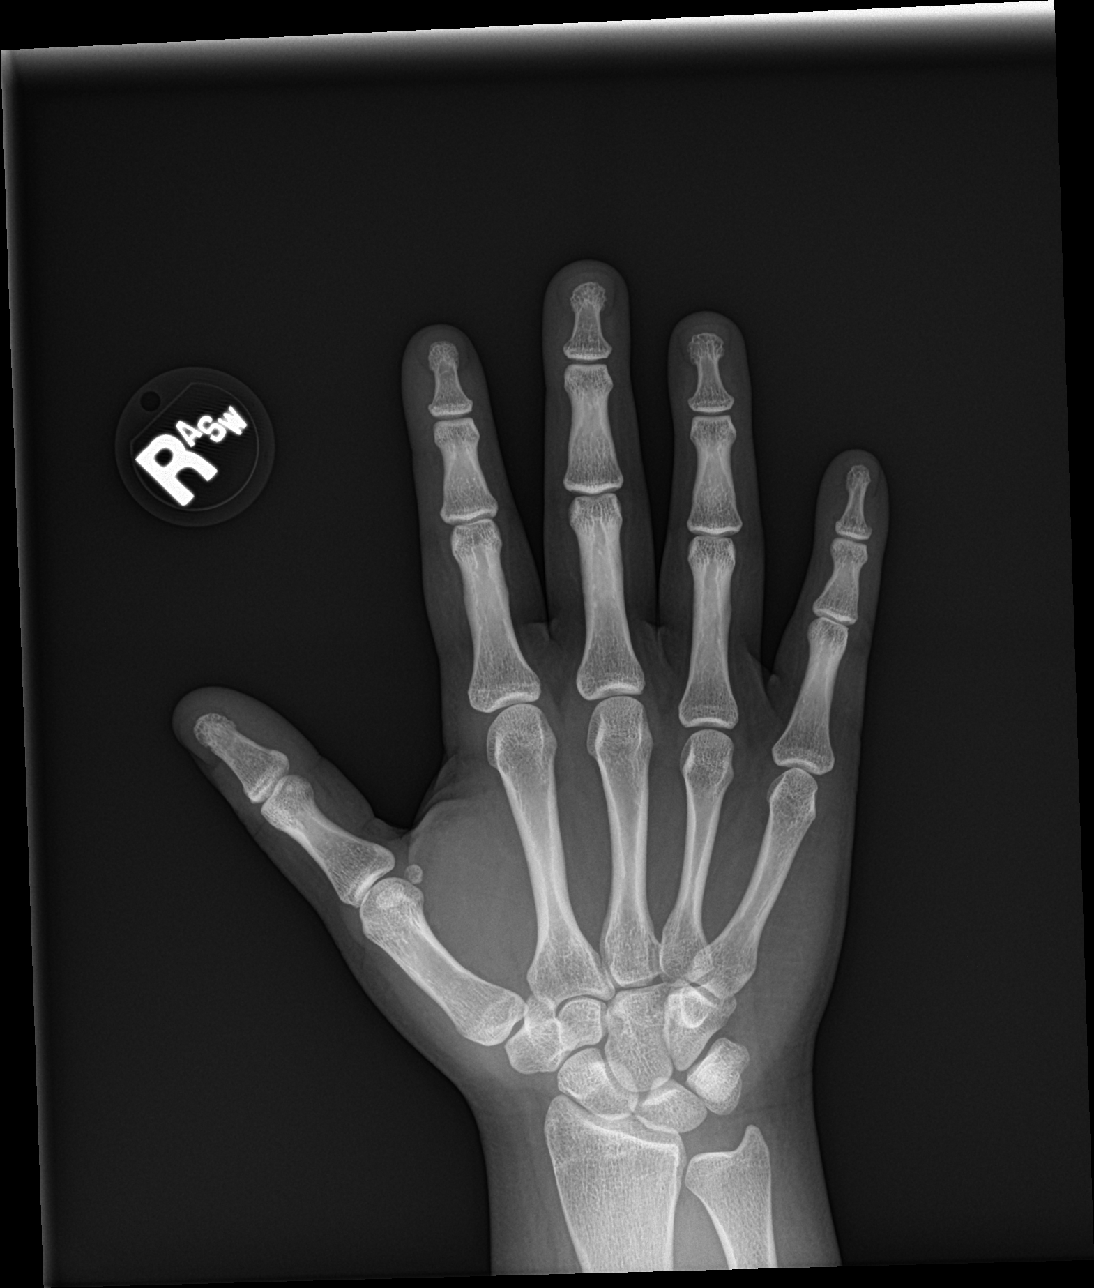

[hand obl]
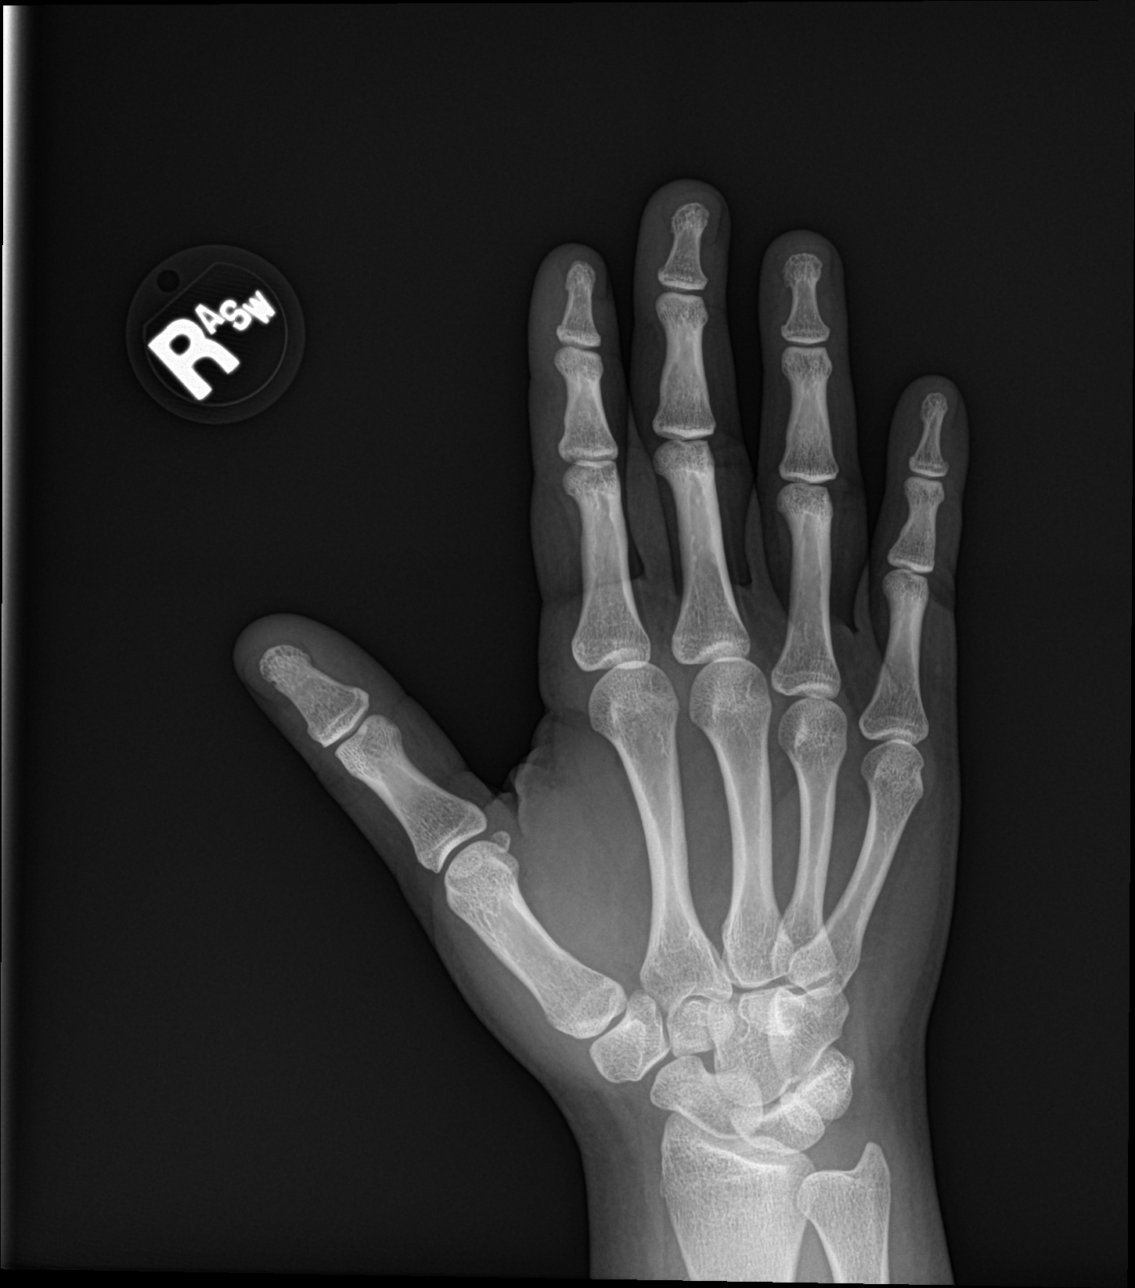

[hand lat]
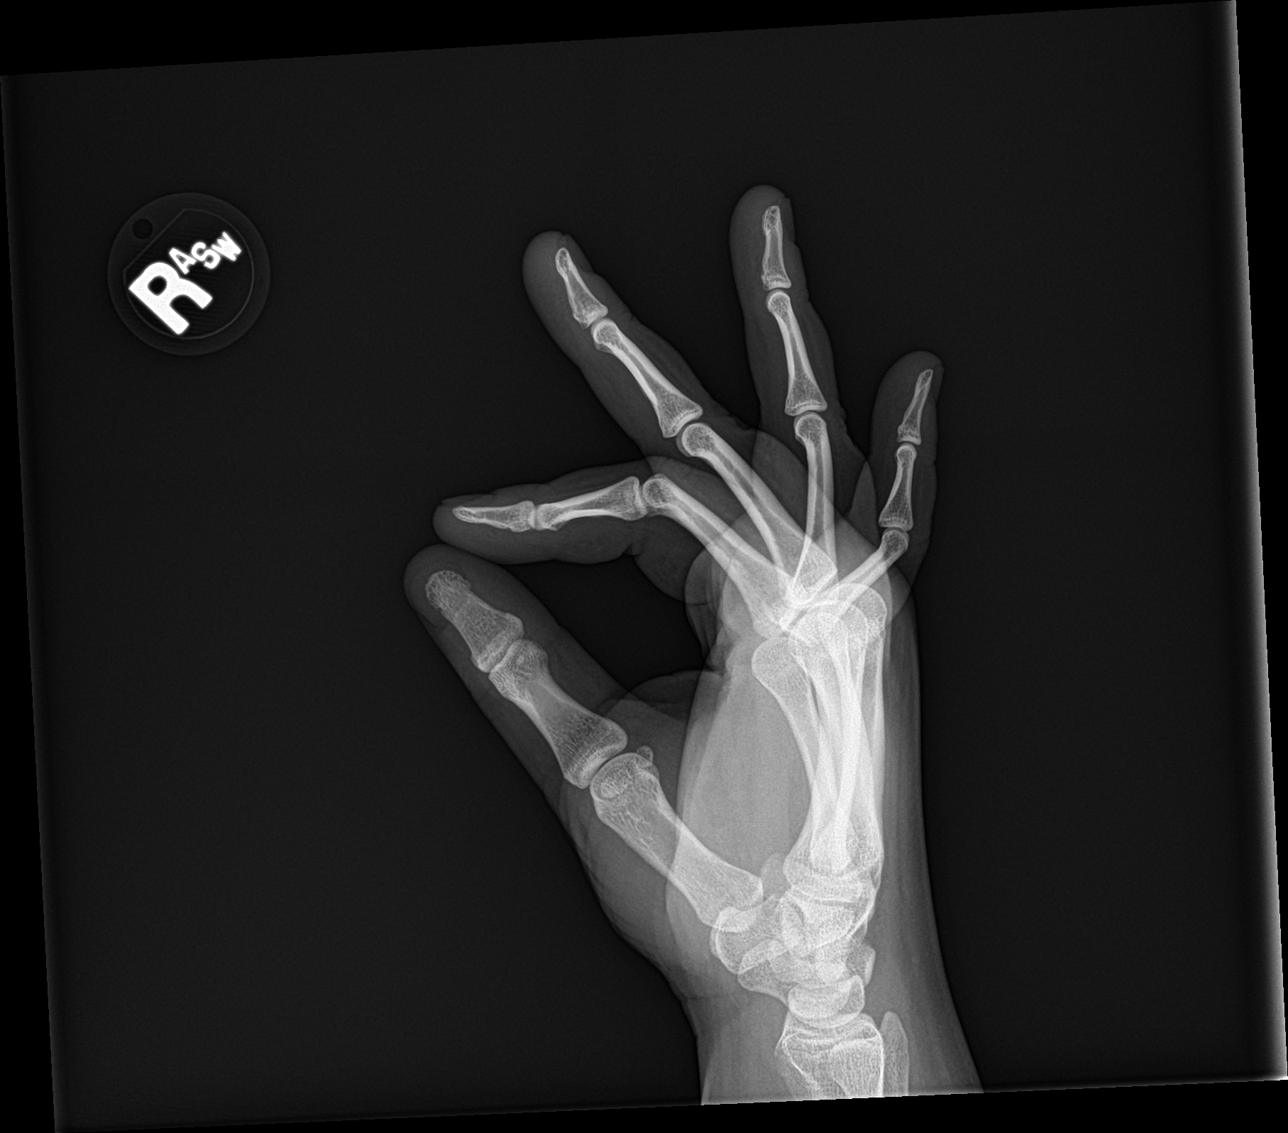

[3 of 3 positions shown; findings below may reference images not displayed]

FINDINGS: Normal bone mineralization. Joint spaces are preserved. No acute
fracture is seen. No dislocation. Mild 2 mm developmental ulnar
negative variance.
IMPRESSION: Normal right hand radiographs.

## 2023-08-13 ENCOUNTER — Ambulatory Visit: Payer: Self-pay | Admitting: *Deleted

## 2023-08-13 ENCOUNTER — Other Ambulatory Visit: Payer: Self-pay

## 2023-08-13 ENCOUNTER — Emergency Department
Admission: EM | Admit: 2023-08-13 | Discharge: 2023-08-13 | Discharge status: Against Medical Advice | Payer: Medicaid Other | Attending: Emergency Medicine | Admitting: Emergency Medicine

## 2023-08-13 DIAGNOSIS — M545 Low back pain, unspecified: Secondary | ICD-10-CM | POA: Insufficient documentation

## 2023-08-13 DIAGNOSIS — W1830XA Fall on same level, unspecified, initial encounter: Secondary | ICD-10-CM | POA: Diagnosis not present

## 2023-08-13 DIAGNOSIS — Z5321 Procedure and treatment not carried out due to patient leaving prior to being seen by health care provider: Secondary | ICD-10-CM | POA: Diagnosis not present

## 2023-08-13 NOTE — Telephone Encounter (Signed)
  Chief Complaint: Pain in back from tailbone to neck after a fall this morning down 3 steps. Symptoms: see above Frequency: Now Pertinent Negatives: Patient denies hitting her head or having dizziness Disposition: [] ED /[] Urgent Care (no appt availability in office) / [x] Appointment(In office/virtual)/ []  [facility]/ [] Home Care/ [] Refused Recommended Disposition /[] [facility]/ []  Follow-up with PCP Additional Notes: She is going to check with her boss and call us back regarding getting off for an appt.   I let her know she did not need to talk with a nurse again but let them know she needs an appt.

## 2023-08-13 NOTE — Telephone Encounter (Signed)
Reason for Disposition  [1] MODERATE back pain (e.g., interferes with normal activities) AND [2] present > 3 days  Answer Assessment - Initial Assessment Questions 1. ONSET: "When did the pain begin?"      I fell this morning an hour and 1/2 ago.    My dog is big and she was running down the stairs and hit the side of my leg and tripped me.   I fell 6 months ago but I was very sick.   I fell down no more than 3 steps.     2. LOCATION: "Where does it hurt?" (upper, mid or lower back)     My lower back is hurting and I fell on my bottom.   Pain at my tailbone that goes up to my neck. 3. SEVERITY: "How bad is the pain?"  (e.g., Scale 1-10; mild, moderate, or severe)   - MILD (1-3): Doesn't interfere with normal activities.    - MODERATE (4-7): Interferes with normal activities or awakens from sleep.    - SEVERE (8-10): Excruciating pain, unable to do any normal activities.      Moderate 4. PATTERN: "Is the pain constant?" (e.g., yes, no; constant, intermittent)      Yes 5. RADIATION: "Does the pain shoot into your legs or somewhere else?"     No    My legs feel funny when standing for more than 5 min. 6. CAUSE:  "What do you think is causing the back pain?"      Fell down 3 steps this morning 7. BACK OVERUSE:  "Any recent lifting of heavy objects, strenuous work or exercise?"     See above 8. MEDICINES: "What have you taken so far for the pain?" (e.g., nothing, acetaminophen, NSAIDS)     Not used anything for pain yet. 9. NEUROLOGIC SYMPTOMS: "Do you have any weakness, numbness, or problems with bowel/bladder control?"     No 10. OTHER SYMPTOMS: "Do you have any other symptoms?" (e.g., fever, abdomen pain, burning with urination, blood in urine)       See above 11. PREGNANCY: "Is there any chance you are pregnant?" "When was your last menstrual period?"       Not asked  Protocols used: Back Pain-A-AH

## 2023-08-13 NOTE — ED Triage Notes (Signed)
Pt to ED via POV from home. Pt reports was going to work and fell down approximately 3-4 stairs landing on her bottom. Pt reports lower back pain and radiation to her neck. Pt denies LOC or head trauma.

## 2023-08-13 NOTE — ED Notes (Signed)
Pt states "Lavenia Atlas been here for 4 hours and Ive not been seen yet, Im going to go to [facility]". Pt advised that wait time was 1hr and that we would get to her as soon as we could. Pt in NAD. Visualized walking out of ED.

## 2023-11-01 DIAGNOSIS — R059 Cough, unspecified: Secondary | ICD-10-CM | POA: Diagnosis not present

## 2023-11-01 DIAGNOSIS — J029 Acute pharyngitis, unspecified: Secondary | ICD-10-CM | POA: Diagnosis not present

## 2023-12-11 ENCOUNTER — Encounter: Payer: Self-pay | Admitting: Family Medicine

## 2023-12-12 ENCOUNTER — Encounter: Payer: Self-pay | Admitting: Family Medicine

## 2023-12-12 ENCOUNTER — Ambulatory Visit: Payer: Medicaid Other | Admitting: Family Medicine

## 2023-12-12 VITALS — BP 120/84 | HR 84 | Ht 59.0 in | Wt 226.2 lb

## 2023-12-12 DIAGNOSIS — S161XXA Strain of muscle, fascia and tendon at neck level, initial encounter: Secondary | ICD-10-CM | POA: Insufficient documentation

## 2023-12-12 MED ORDER — CELECOXIB 50 MG PO CAPS: 50.0000 mg | ORAL_CAPSULE | Freq: Two times a day (BID) | ORAL | 0 refills | Status: AC | PRN

## 2023-12-12 MED ORDER — METHOCARBAMOL 500 MG PO TABS: 500.0000 mg | ORAL_TABLET | Freq: Three times a day (TID) | ORAL | 0 refills | Status: AC | PRN

## 2023-12-12 NOTE — Progress Notes (Signed)
 Primary Care / Sports Medicine Office Visit  Patient Information:  Patient ID: MIKYLAH ACKROYD, female DOB: 12-12-2000 Age: 23 y.o. MRN: [id number]   ANISTON CHRISTMAN is a pleasant 23 y.o. female presenting with the following:  Chief Complaint  Patient presents with   Cyst    Patient presents today for a knot on her right side of the jaw line. She states it is tender to touch, she just noticed it yesterday. She has not noticed a change in her swallowing. She does vape.     Vitals:   12/12/23 1504  BP: 120/84  Pulse: 84  SpO2: 97%   Vitals:   12/12/23 1504  Weight: 226 lb 3.2 oz (102.6 kg)  Height: 4' 11 (1.499 m)   Body mass index is 45.69 kg/m.  No results found.   Independent interpretation of notes and tests performed by another provider:   None  Procedures performed:   None  Pertinent History, Exam, Impression, and Recommendations:   Problem List Items Addressed This Visit     Strain of sternocleidomastoid muscle - Primary   History of Present Illness SERAI TUKES is a 23 year old female who presents with right jaw pain and swelling.  She has been experiencing right jaw pain and swelling since yesterday. The pain was noticed while adjusting her earring and is described as a sore knot near the jaw, tender to touch. She initially considered it might be related to her wisdom teeth but realized it was too far forward. No pain is present when opening or closing her mouth, chewing, or crunching on something. No fever, sore throat, or unusual taste in the mouth. She has not attempted any treatments such as ice, heat, or medications for the jaw pain.  She has a history of jaw popping for the last few years, which has not been bothersome and is not associated with pain.  Physical Exam HEENT: Right tympanic membrane appears benign. Left tympanic membrane appears benign. Oral mucosa, gums, and teeth within normal limits. NECK: Non-tender maxillary, ethmoid, and  frontal sinuses. Left submandibular lymph node tender. MUSCULOSKELETAL: Right sternocleidomastoid muscle tenderness. Pain recreated with resisted torsion  Assessment and Plan Sternocleidomastoid Strain Painful swelling in the right submandibular region, likely due to sternocleidomastoid strain. No signs of infection or other concerning symptoms. -Start anti-inflammatory medication (Celebrex ) twice daily as needed. -Consider Robaxin  muscle relaxer if necessary, with caution due to potential drowsiness. -Apply heat treatment, specifically steamed, humidified heat. -Start gentle neck exercises after a few days of medication and heat treatment. (Provided) -Contact if no improvement by end of next week.  General Health Maintenance -Schedule routine follow-up appointment for June 2025.        Orders & Medications Medications:  Meds ordered this encounter  Medications   methocarbamol  (ROBAXIN ) 500 MG tablet    Sig: Take 1 tablet (500 mg total) by mouth every 8 (eight) hours as needed for up to 10 days for muscle spasms.    Dispense:  20 tablet    Refill:  0   celecoxib  (CELEBREX ) 50 MG capsule    Sig: Take 1 capsule (50 mg total) by mouth 2 (two) times daily as needed for up to 10 days for pain.    Dispense:  20 capsule    Refill:  0   No orders of the defined types were placed in this encounter.    Return in about 4 months (around 04/10/2024) for CPE.     Jayven Naill  JINNY Ku, MD, [facility]   Primary Care Sports Medicine Primary Care and Sports Medicine at [facility]

## 2023-12-12 NOTE — Assessment & Plan Note (Signed)
 History of Present Illness Samantha Cabrera is a 23 year old female who presents with right jaw pain and swelling.  She has been experiencing right jaw pain and swelling since yesterday. The pain was noticed while adjusting her earring and is described as a sore knot near the jaw, tender to touch. She initially considered it might be related to her wisdom teeth but realized it was too far forward. No pain is present when opening or closing her mouth, chewing, or crunching on something. No fever, sore throat, or unusual taste in the mouth. She has not attempted any treatments such as ice, heat, or medications for the jaw pain.  She has a history of jaw popping for the last few years, which has not been bothersome and is not associated with pain.  Physical Exam HEENT: Right tympanic membrane appears benign. Left tympanic membrane appears benign. Oral mucosa, gums, and teeth within normal limits. NECK: Non-tender maxillary, ethmoid, and frontal sinuses. Left submandibular lymph node tender. MUSCULOSKELETAL: Right sternocleidomastoid muscle tenderness. Pain recreated with resisted torsion  Assessment and Plan Sternocleidomastoid Strain Painful swelling in the right submandibular region, likely due to sternocleidomastoid strain. No signs of infection or other concerning symptoms. -Start anti-inflammatory medication (Celebrex ) twice daily as needed. -Consider Robaxin  muscle relaxer if necessary, with caution due to potential drowsiness. -Apply heat treatment, specifically steamed, humidified heat. -Start gentle neck exercises after a few days of medication and heat treatment. (Provided) -Contact if no improvement by end of next week.  General Health Maintenance -Schedule routine follow-up appointment for June 2025.

## 2023-12-12 NOTE — Patient Instructions (Signed)
 Plan for Sternocleidomastoid Strain  1. Medication:    - Start anti-inflammatory medication (Celebrex ) twice daily as needed.    - Consider Robaxin  muscle relaxer if necessary, being cautious of potential drowsiness.  2. Symptom Relief:    - Apply steamed, humidified heat to the affected area.  3. Rehabilitation:    - Begin gentle neck exercises after a few days of medication and heat treatment. (Exercises provided)  4. Follow-Up:    - Contact the office if there is no improvement by the end of next week.

## 2024-01-08 ENCOUNTER — Other Ambulatory Visit: Payer: Self-pay

## 2024-01-08 ENCOUNTER — Emergency Department
Admission: EM | Admit: 2024-01-08 | Discharge: 2024-01-08 | Disposition: A | Discharge status: Home / Self Care | Attending: Emergency Medicine | Admitting: Emergency Medicine

## 2024-01-08 ENCOUNTER — Encounter: Payer: Self-pay | Admitting: Emergency Medicine

## 2024-01-08 DIAGNOSIS — R509 Fever, unspecified: Secondary | ICD-10-CM | POA: Diagnosis present

## 2024-01-08 DIAGNOSIS — J101 Influenza due to other identified influenza virus with other respiratory manifestations: Secondary | ICD-10-CM | POA: Diagnosis not present

## 2024-01-08 LAB — URINALYSIS, ROUTINE W REFLEX MICROSCOPIC
Bilirubin Urine: NEGATIVE
Glucose, UA: NEGATIVE mg/dL
Hgb urine dipstick: NEGATIVE
Ketones, ur: NEGATIVE mg/dL
Nitrite: NEGATIVE
Protein, ur: NEGATIVE mg/dL
Specific Gravity, Urine: 1.017 (ref 1.005–1.030)
pH: 5 (ref 5.0–8.0)

## 2024-01-08 LAB — RESP PANEL BY RT-PCR (RSV, FLU A&B, COVID)  RVPGX2
Influenza A by PCR: POSITIVE — AB
Influenza B by PCR: NEGATIVE
Resp Syncytial Virus by PCR: NEGATIVE
SARS Coronavirus 2 by RT PCR: NEGATIVE

## 2024-01-08 LAB — GROUP A STREP BY PCR: Group A Strep by PCR: NOT DETECTED

## 2024-01-08 MED ORDER — ACETAMINOPHEN 325 MG PO TABS
650.0000 mg | ORAL_TABLET | Freq: Once | ORAL | Status: AC
  Administered 2024-01-08: 650 mg via ORAL
  Filled 2024-01-08: qty 2

## 2024-01-08 MED ORDER — BENZONATATE 100 MG PO CAPS: 100.0000 mg | ORAL_CAPSULE | Freq: Three times a day (TID) | ORAL | 0 refills | Status: DC | PRN

## 2024-01-08 MED ORDER — ONDANSETRON 4 MG PO TBDP: 4.0000 mg | ORAL_TABLET | Freq: Three times a day (TID) | ORAL | 0 refills | Status: DC | PRN

## 2024-01-08 NOTE — ED Provider Notes (Signed)
 [facility] Emergency Department Provider Note     Event Date/Time   First MD Initiated Contact with Patient 01/08/24 1349     (approximate)   History   Cough and Fever   HPI  Samantha Cabrera is a 23 y.o. female with no significant past medical history presents to the ED for evaluation of a productive cough and sore throat x 3 days.  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  Associated symptoms includes fever and nausea with no vomiting.  She has taken DayQuil and NyQuil with minimal relief.  Of note patient reports every time she gets sick like this she has a urinary tract infection and is requesting a urinalysis.  She denies any vaginal or urinary symptoms such as dysuria and urinary frequency.    Physical Exam   Triage Vital Signs: ED Triage Vitals  Encounter Vitals Group     BP 01/08/24 1245 133/82     Systolic BP Percentile --      Diastolic BP Percentile --      Pulse Rate 01/08/24 1245 (!) 125     Resp 01/08/24 1245 20     Temp 01/08/24 1245 (!) 102.9 F (39.4 C)     Temp Source 01/08/24 1245 Oral     SpO2 01/08/24 1245 98 %     Weight 01/08/24 1243 205 lb (93 kg)     Height 01/08/24 1243 5' (1.524 m)     Head Circumference --      Peak Flow --      Pain Score --      Pain Loc --      Pain Education --      Exclude from Growth Chart --     Most recent vital signs: Vitals:   01/08/24 1245 01/08/24 1423  BP: 133/82 128/84  Pulse: (!) 125 94  Resp: 20 20  Temp: (!) 102.9 F (39.4 C) 99.1 F (37.3 C)  SpO2: 98% 98%   General: Alert and oriented. INAD.  Nontoxic.    Head:  NCAT.  Nose:   Mucosa is moist. No rhinorrhea. Throat: Oropharynx clear. Mild erythema. No exudates. Tonsils not enlarged. Uvula is midline. CV:  Good peripheral perfusion. RRR.   RESP:  Normal effort. LCTAB. No retractions.   ED Results / Procedures / Treatments   Labs (all labs ordered are listed, but only  abnormal results are displayed) Labs Reviewed  RESP PANEL BY RT-PCR (RSV, FLU A&B, COVID)  RVPGX2 - Abnormal; Notable for the following components:      Result Value   Influenza A by PCR POSITIVE (*)    All other components within normal limits  URINALYSIS, ROUTINE W REFLEX MICROSCOPIC - Abnormal; Notable for the following components:   Color, Urine YELLOW (*)    APPearance HAZY (*)    Leukocytes,Ua TRACE (*)    Bacteria, UA RARE (*)    All other components within normal limits  GROUP A STREP BY PCR   No results found.  PROCEDURES:  Critical Care performed: No  Procedures  MEDICATIONS ORDERED IN ED: Medications  acetaminophen (TYLENOL) tablet 650 mg (650 mg Oral Given 01/08/24 1251)    IMPRESSION / MDM / ASSESSMENT AND PLAN / ED COURSE  I reviewed the triage vital signs and the nursing notes.  23 y.o. female presents to the emergency department for evaluation and treatment of . See HPI for further details.   Differential diagnosis includes, but is not limited to COVID, RSV, influenza,  UTI  Patient's presentation is most consistent with acute complicated illness / injury requiring diagnostic workup.  She is alert and oriented.  She is initially tachy at 125 with a fever of 102.9.  Tylenol given in triage.  Vital signs improved during duration of ED encounter.  I suspect this to be due to acute viral infection as her respiratory panel is positive for influenza A.  Physical exam findings are as stated above and overall benign.  No indication for respiratory distress at this time.  Lung exam is normal.  No further workup needed at this time.  Urinalysis is reassuring. I do believe patient is in stable condition for discharge home.  She is encouraged to follow-up with her primary care as needed.  ED return precautions are discussed thoroughly.  Work note provided.  FINAL CLINICAL IMPRESSION(S) / ED DIAGNOSES   Final diagnoses:  Influenza A   Rx / DC  Orders   ED Discharge Orders          Ordered    benzonatate (TESSALON PERLES) 100 MG capsule  3 times daily PRN        01/08/24 1441    ondansetron (ZOFRAN-ODT) 4 MG disintegrating tablet  Every 8 hours PRN        01/08/24 1441           Note:  This document was prepared using Dragon voice recognition software and may include unintentional dictation errors.    Romeo Apple, Jaliah Foody A, PA-C 01/08/24 1453    Janith Lima, MD 01/08/24 [phone]

## 2024-01-08 NOTE — ED Triage Notes (Signed)
 Pt via POV from home. Pt c/o cough, fever, sore throat for the past 3 days. Cough is productive. Denies any sick contacts. Fever was 101 and took tylenol around 3:30am. Pt also would like her urine tested for a UTI. Pt is A&Ox4 and NAD, ambulatory to triage.

## 2024-01-08 NOTE — ED Notes (Signed)
 See triage notes. Patient c/o flu like symptoms times three days.

## 2024-01-08 NOTE — Discharge Instructions (Addendum)
 You were seen in the emergency department today for a cough. Your respiratory panel which includes RSV, influenza and COVID is positive for influenza A.    A viral cough may last up to 2-3 weeks.   Take tylenol or ibuprofen for pain or fever as directed.   Stay hydrated by drinking plenty of fluids to thin mucus. Get adequate amount of sleep and avoid overexertion. Consider a humidifier at night. Warm teas and a spoonful of honey may help reduce cough frequency. Follow up with your primary care provider as needed.   For sore throat Use throat lozenges or Chloraseptic spray.  Gargle with warm salt water several times daily

## 2024-03-01 ENCOUNTER — Ambulatory Visit: Admitting: Family Medicine

## 2024-03-01 ENCOUNTER — Ambulatory Visit (LOCAL_COMMUNITY_HEALTH_CENTER): Admitting: Nurse Practitioner

## 2024-03-01 VITALS — BP 109/66 | HR 66 | Ht 60.0 in | Wt 236.2 lb

## 2024-03-01 DIAGNOSIS — Z309 Encounter for contraceptive management, unspecified: Secondary | ICD-10-CM

## 2024-03-01 DIAGNOSIS — Z3009 Encounter for other general counseling and advice on contraception: Secondary | ICD-10-CM | POA: Diagnosis not present

## 2024-03-01 DIAGNOSIS — Z0000 Encounter for general adult medical examination without abnormal findings: Secondary | ICD-10-CM

## 2024-03-01 DIAGNOSIS — Z113 Encounter for screening for infections with a predominantly sexual mode of transmission: Secondary | ICD-10-CM

## 2024-03-01 NOTE — Progress Notes (Signed)
 Patient is here for PE and contraception. FP packet given to patient and contents reviewed with the patient. Pt Nexplanon  not due for removal until 2026. Condoms declined. Austine Lefort, RN.

## 2024-03-07 ENCOUNTER — Encounter: Payer: Self-pay | Admitting: Nurse Practitioner

## 2024-03-07 NOTE — Progress Notes (Signed)
 [employer] HE[facility] DEPARTMENT [facility] [address], [address] [place] [address] Main phone: [phone]  Family Planning Visit - Repeat Yearly Visit  Subjective:  Samantha Cabrera is a 23 y.o. G1P1001  being seen today for an annual wellness visit and to discuss contraception options. The patient is currently using hormonal implant for pregnancy prevention. Patient does not want a pregnancy in the next year.   Patient reports they are looking for a method with the following characteristics:  High efficacy at preventing pregnancy Discrete method Long term method Method that does not involve too much memory  Patient has the following medical problems:  Patient Active Problem List   Diagnosis Date Noted   Obesity, Class III, BMI 40-49.9 (morbid obesity) 12/03/2022   Tobacco abuse 12/03/2022    Chief Complaint  Patient presents with   Annual Exam   Contraception   HPI Patient is a pleasant 23 y.o. female who presents to the office today for annual well woman exam and is requesting asymptomatic STI testing.   Patient reports concerns today include the following: Weight gain: Patient endorses 10lb weight gain in the last 1-2 months. She reports she is going to the gym regularly and has changed her diet to decrease carb and sugar intake, but is not losing weight. She indicates that she drinks 1 gallon of water per day.   Patient indicates 1 female partner in the last 2 and 12 months. She reports practicing vaginal sex and does not use condoms. Patient indicates no STI history. Patient reports last sex was 02/23/24, one week ago. She indicates use of hormonal implant as contraception method.  Patient indicates LMP is irregular with implant in place.   Review of Systems  Constitutional:  Negative for weight loss.       Positive for weight gain.  HENT:  Negative for sore throat.   Eyes:  Negative for blurred vision.  Respiratory:  Negative for  cough, shortness of breath and wheezing.   Cardiovascular:  Negative for chest pain and claudication.  Gastrointestinal:  Negative for nausea and vomiting.  Genitourinary:  Negative for dysuria and frequency.  Skin:  Negative for rash.  Neurological:  Negative for dizziness, seizures and headaches.  Endo/Heme/Allergies:  Does not bruise/bleed easily.   See flowsheet for other program required questions.   Diabetes screening This patient is 23 y.o. with a BMI of Body mass index is 46.13 kg/m.Samantha Cabrera  Is patient eligible for diabetes screening (age >35 and BMI >25)?  no  Was Hgb A1c ordered? not applicable  STI screening Patient reports 1 of partners in last year.  Does this patient desire STI screening?  Yes  Hepatitis C screening Has patient been screened once for HCV in the past?  No  No results found for: "HCVAB"  Does the patient meet criteria for HCV testing? Yes  (If yes-- Screen for HCV through [facility]) Criteria:  Since the last HCV result, does the patient have any of the following? - Current drug use - Have a partner with drug use - Has been incarcerated  Hepatitis B screening Does the patient meet criteria for HBV testing? Yes Criteria:  -Household, sexual or needle sharing contact with HBV -History of drug use -HIV positive -Those with known Hep C  Cervical Cancer Screening  No Cervical Cancer Screening results to display.  Health Maintenance Due  Topic Date Due   HPV VACCINES (1 - 3-dose series) Never done   Meningococcal B  Vaccine (1 of 2 - Standard) Never done   CHLAMYDIA SCREENING  12/13/2021   Cervical Cancer Screening (Pap smear)  Never done   COVID-19 Vaccine (1 - 2024-25 season) Never done    The following portions of the patient's history were reviewed and updated as appropriate: allergies, current medications, past family history, past medical history, past social history, past surgical history and problem list. Problem list  updated.  Objective:   Vitals:   03/01/24 1109  BP: 109/66  Pulse: 66  Weight: 236 lb 3.2 oz (107.1 kg)  Height: 5' (1.524 m)    Physical Exam  Assessment and Plan:  Samantha Cabrera is a 23 y.o. female G1P1001 presenting to the [facility] Department for an yearly wellness and contraception visit  1. Family planning (Primary) Contraception counseling: Reviewed options based on patient desire and reproductive life plan. Patient is interested in Hormonal Implant. This was not provided to the patient today.   Risks, benefits, and typical effectiveness rates were reviewed.  Questions were answered.  Written information was also given to the patient to review.    The patient will follow up in  1 years for surveillance at which time Nexplanon  implant will be due to be removed. The patient was told to call with any further questions, or with any concerns about this method of contraception.  Emphasized use of condoms 100% of the time for STI prevention.  Educated on ECP and assessed need for ECP. Patient does not qualify for ECP based on no unprotected sex.   2. Well woman exam (no gynecological exam) PAP due today per ASCCP guidelines as patient is 23 y.o. and has never had one; however patient declined.  CBE not indicated based on age guidelines.  Patient has a PCP.   Diet: Discussed lifestyle modifications to promote healthy weight management including diet and exercise such as increasing fruit, vegetable, and whole grain intake. Being mindful of portion size and to limit high-fat, high-cholesterol foods like those that are fried, red meats, and dairy products. Also limit simple carbohydrates and foods and beverages high in sugar.  Increase water intake.  Patient encouraged to exercise for at least 30 minutes at a moderate activity level 5 times a week or for 3 times a week with a vigorous 1-hour exercise routine. Patient encouraged to make gradual changes and not over exert  them self.    3. Screening for venereal disease  - HBV Antigen/Antibody [facility] Lab - HIV/HCV [place] [facility] - Syphilis Serology, [facility]   Return in about 1 year (around 03/01/2025) for annual well-woman exam.  Future Appointments  Date Time Provider Department Center  04/12/2024  3:20 PM Ma Saupe, MD [facility]    Merleen Stare, NP

## 2024-04-01 ENCOUNTER — Ambulatory Visit: Payer: Self-pay

## 2024-04-01 NOTE — Telephone Encounter (Signed)
 Noted  Pt has a appt.  KP

## 2024-04-01 NOTE — Telephone Encounter (Signed)
 Copied from CRM [id number]. Topic: Clinical - Red Word Triage >> Apr 01, 2024 10:36 AM Crispin Dolphin wrote: Red Word that prompted transfer to Nurse Triage: Burning and constant urination - UTI - gets them often - request Rx    Chief Complaint: Urinary frequency, burning, back pain Symptoms: above Frequency: today Pertinent Negatives: Patient denies fever Disposition: [] ED /[] Urgent Care (no appt availability in office) / [x] Appointment(In office/virtual)/ []  [facility]/ [] Home Care/ [] Refused Recommended Disposition /[] [facility]/ []  Follow-up with PCP Additional Notes: agrees with appointment.   Reason for Disposition  Urinating more frequently than usual (i.e., frequency)  Answer Assessment - Initial Assessment Questions 1. SYMPTOM: "What's the main symptom you're concerned about?" (e.g., frequency, incontinence)     Burning, frequency 2. ONSET: "When did the    start?"     today 3. PAIN: "Is there any pain?" If Yes, ask: "How bad is it?" (Scale: 1-10; mild, moderate, severe)     4-5 4. CAUSE: "What do you think is causing the symptoms?"     UTI 5. OTHER SYMPTOMS: "Do you have any other symptoms?" (e.g., blood in urine, fever, flank pain, pain with urination)     Back pain 6. PREGNANCY: "Is there any chance you are pregnant?" "When was your last menstrual period?"     no  Protocols used: Urinary Symptoms-A-AH

## 2024-04-02 ENCOUNTER — Ambulatory Visit: Admitting: Student

## 2024-04-06 NOTE — Progress Notes (Signed)
 Erroneous encounter.   Tempie Fee, MD

## 2024-04-12 ENCOUNTER — Encounter: Payer: Medicaid Other | Admitting: Family Medicine

## 2024-04-12 ENCOUNTER — Encounter: Payer: Self-pay | Admitting: Family Medicine

## 2024-04-15 NOTE — Addendum Note (Signed)
 Addended by: Caren Channel on: 04/15/2024 01:24 PM   Modules accepted: Orders

## 2024-09-22 ENCOUNTER — Ambulatory Visit (INDEPENDENT_AMBULATORY_CARE_PROVIDER_SITE_OTHER): Payer: Self-pay | Admitting: Internal Medicine

## 2024-09-22 ENCOUNTER — Encounter: Payer: Self-pay | Admitting: Internal Medicine

## 2024-09-22 DIAGNOSIS — J029 Acute pharyngitis, unspecified: Secondary | ICD-10-CM

## 2024-09-22 LAB — POCT RAPID STREP A (OFFICE): Rapid Strep A Screen: NEGATIVE

## 2024-09-22 MED ORDER — PREDNISONE 10 MG PO TABS
ORAL_TABLET | ORAL | 0 refills | Status: AC
Start: 2024-09-22 — End: 2024-09-28

## 2024-09-22 MED ORDER — AZITHROMYCIN 250 MG PO TABS
ORAL_TABLET | ORAL | 0 refills | Status: AC
Start: 2024-09-22 — End: 2024-09-27

## 2024-09-22 NOTE — Addendum Note (Signed)
 Addended by: JUSTUS LEITA DEL on: 09/22/2024 11:49 AM   Modules accepted: Orders

## 2024-09-22 NOTE — Progress Notes (Addendum)
 Date:  09/22/2024   Name:  Samantha Cabrera   DOB:  [birth year]   MRN:  [id number]   Chief Complaint: Cough (Cough, sore throat chills, fever of 103, fatigue. X 1 week. Lost sense of taste and smell last night. )  Sore Throat  This is a new problem. The current episode started in the past 7 days. The problem has been unchanged. The maximum temperature recorded prior to her arrival was 100.4 - 100.9 F. The pain is moderate. Associated symptoms include ear discharge, ear pain, swollen glands and trouble swallowing. Pertinent negatives include no coughing, headaches or shortness of breath. She has had exposure to strep.    Review of Systems  Constitutional:  Positive for chills, fatigue and fever.  HENT:  Positive for ear discharge, ear pain, sore throat and trouble swallowing.   Eyes:  Negative for visual disturbance.  Respiratory:  Negative for cough, chest tightness and shortness of breath.   Cardiovascular:  Negative for chest pain and palpitations.  Neurological:  Negative for dizziness and headaches.  Psychiatric/Behavioral:  Positive for sleep disturbance. Negative for dysphoric mood. The patient is not nervous/anxious.      Lab Results  Component Value Date   NA 139 03/25/2023   K 4.3 03/25/2023   CO2 23 03/25/2023   GLUCOSE 88 03/25/2023   BUN 9 03/25/2023   CREATININE 0.73 03/25/2023   CALCIUM  8.8 03/25/2023   EGFR 121 03/25/2023   GFRNONAA >60 08/17/2020   Lab Results  Component Value Date   CHOL 150 12/24/2021   HDL 35 (L) 12/24/2021   LDLCALC 84 12/24/2021   TRIG 177 (H) 12/24/2021   CHOLHDL 4.3 12/24/2021   Lab Results  Component Value Date   TSH 0.934 12/24/2021   No results found for: HGBA1C Lab Results  Component Value Date   WBC 10.4 03/25/2023   HGB 11.5 03/25/2023   HCT 35.1 03/25/2023   MCV 73 (L) 03/25/2023   PLT 229 03/25/2023   Lab Results  Component Value Date   ALT 19 03/25/2023   AST 17 03/25/2023   ALKPHOS 111 (H) 03/25/2023    BILITOT 0.2 03/25/2023   Lab Results  Component Value Date   VD25OH 23.3 (L) 12/24/2021     Patient Active Problem List   Diagnosis Date Noted   Obesity, Class III, BMI 40-49.9 (morbid obesity) (HCC) 12/03/2022   Tobacco abuse 12/03/2022    Allergies  Allergen Reactions   Penicillins Hives and Swelling    Tongue swells   Augmentin [Amoxicillin-Pot Clavulanate] Hives   Amoxicillin Rash   Latex Dermatitis    Past Surgical History:  Procedure Laterality Date   FINGER SURGERY Left 10/2013    Social History   Tobacco Use   Smoking status: Former    Types: E-cigarettes   Smokeless tobacco: Never  Vaping Use   Vaping status: Former  Substance Use Topics   Alcohol use: Never   Drug use: Never     Medication list has been reviewed and updated.  Current Meds  Medication Sig   azithromycin  (ZITHROMAX  Z-PAK) 250 MG tablet UAD   magic mouthwash (nystatin , lidocaine , diphenhydrAMINE , alum & mag hydroxide) suspension Swish and swallow 5 mLs 3 (three) times daily as needed for mouth pain.   predniSONE (DELTASONE) 10 MG tablet Take 6 tablets (60 mg total) by mouth daily with breakfast for 1 day, THEN 5 tablets (50 mg total) daily with breakfast for 1 day, THEN 4 tablets (40 mg total) daily  with breakfast for 1 day, THEN 3 tablets (30 mg total) daily with breakfast for 1 day, THEN 2 tablets (20 mg total) daily with breakfast for 1 day, THEN 1 tablet (10 mg total) daily with breakfast for 1 day.       09/22/2024   11:16 AM 12/12/2023    3:10 PM 03/29/2022   10:10 AM 02/26/2022    3:00 PM  GAD 7 : Generalized Anxiety Score  Nervous, Anxious, on Edge 0 0 0 0  Control/stop worrying 0 0 0 0  Worry too much - different things 0 0 0 0  Trouble relaxing 0 0 0 0  Restless 0 0 0 0  Easily annoyed or irritable 0 0 0 0  Afraid - awful might happen 0 0 0 0  Total GAD 7 Score 0 0 0 0  Anxiety Difficulty Not difficult at all Not difficult at all Not difficult at all        09/22/2024    11:16 AM 03/01/2024   11:37 AM 03/01/2024   11:36 AM  Depression screen PHQ 2/9  Decreased Interest 0 0 0  Down, Depressed, Hopeless 0 0   PHQ - 2 Score 0 0 0  Altered sleeping 0    Tired, decreased energy 0    Change in appetite 0    Feeling bad or failure about yourself  0    Trouble concentrating 0    Moving slowly or fidgety/restless 0    Suicidal thoughts 0    PHQ-9 Score 0    Difficult doing work/chores Not difficult at all      BP Readings from Last 3 Encounters:  09/22/24 126/84  03/01/24 109/66  01/08/24 128/84    Physical Exam Constitutional:      Appearance: Normal appearance. She is obese. She is ill-appearing.  HENT:     Right Ear: Tympanic membrane is retracted. Tympanic membrane is not erythematous.     Left Ear: Tympanic membrane is retracted. Tympanic membrane is not erythematous.     Ears:     Comments: Moderate amount of dark cerumen bilaterally    Nose:     Right Sinus: No maxillary sinus tenderness or frontal sinus tenderness.     Left Sinus: No maxillary sinus tenderness or frontal sinus tenderness.     Mouth/Throat:     Mouth: Mucous membranes are moist.     Pharynx: Posterior oropharyngeal erythema present.     Tonsils: Tonsillar exudate and tonsillar abscess present. 2+ on the right. 3+ on the left.     Comments: Right tonsil 2+ Left tonsil 3+ Exudate and crypt abscesses present bilaterally Cardiovascular:     Rate and Rhythm: Normal rate and regular rhythm.  Pulmonary:     Effort: Pulmonary effort is normal.     Breath sounds: No wheezing or rhonchi.  Musculoskeletal:     Cervical back: Normal range of motion. Tenderness present.  Lymphadenopathy:     Cervical: Cervical adenopathy present.  Neurological:     Mental Status: She is alert.   Rapid strep test is negative..   Wt Readings from Last 3 Encounters:  09/22/24 241 lb (109.3 kg)  03/01/24 236 lb 3.2 oz (107.1 kg)  01/08/24 205 lb (93 kg)    BP 126/84   Pulse (!) 112    Temp 98.4 F (36.9 C) (Oral)   Ht 5' (1.524 m)   Wt 241 lb (109.3 kg)   LMP 06/23/2024 (Approximate)   SpO2 97%   BMI  47.07 kg/m   Assessment and Plan:  Problem List Items Addressed This Visit   None Visit Diagnoses       Pharyngitis, unspecified etiology       likely strept vs other bacterial - Zpak tylenol  alternating with Advil  for pain; warm liquids, gargle with salt water refer to ENT re; tonsillectomy   Relevant Medications   azithromycin  (ZITHROMAX  Z-PAK) 250 MG tablet   predniSONE (DELTASONE) 10 MG tablet   Other Relevant Orders   Ambulatory referral to ENT   POCT rapid strep A (Completed)       No follow-ups on file.    Leita HILARIO Adie, MD [facility] [place]

## 2024-11-08 ENCOUNTER — Encounter: Payer: Self-pay | Admitting: *Deleted

## 2024-11-08 ENCOUNTER — Other Ambulatory Visit: Payer: Self-pay

## 2024-11-08 DIAGNOSIS — J111 Influenza due to unidentified influenza virus with other respiratory manifestations: Secondary | ICD-10-CM | POA: Insufficient documentation

## 2024-11-08 MED ORDER — GUAIFENESIN ER 600 MG PO TB12: 600.0000 mg | ORAL_TABLET | Freq: Two times a day (BID) | ORAL | 0 refills | Status: AC

## 2024-11-08 MED ORDER — IPRATROPIUM-ALBUTEROL 0.5-2.5 (3) MG/3ML IN SOLN
3.0000 mL | Freq: Once | RESPIRATORY_TRACT | Status: AC
  Administered 2024-11-08: 3 mL via RESPIRATORY_TRACT
  Filled 2024-11-08: qty 3

## 2024-11-08 NOTE — ED Triage Notes (Signed)
 Pt endorses flu symptoms, 3 days of congestion, runny nose, cough, pain worse with deep breath. (vomiting and diarrhea has subsided). Last med was this morning.

## 2024-11-08 NOTE — ED Provider Notes (Signed)
 "  [facility] Provider Note    Event Date/Time   First MD Initiated Contact with Patient 11/08/24 2237     (approximate)   History   Chief Complaint Nasal Congestion   HPI  Samantha Cabrera is a 24 y.o. female with past medical history of anemia who presents to the ED complaining of nasal congestion.  Patient reports that she has had 4 days of cough, congestion, fevers, nausea, vomiting, and diarrhea.  Nausea, vomiting, and diarrhea have improved over the past 2 days, but she reports ongoing fevers and feeling generally unwell.  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  She denies significant pain in her chest or difficulty breathing, does state that she feels tight in her chest.     Physical Exam   Triage Vital Signs: ED Triage Vitals  Encounter Vitals Group     BP 11/08/24 1941 138/79     Girls Systolic BP Percentile --      Girls Diastolic BP Percentile --      Boys Systolic BP Percentile --      Boys Diastolic BP Percentile --      Pulse Rate 11/08/24 1941 (!) 109     Resp 11/08/24 1941 18     Temp 11/08/24 1941 98.9 F (37.2 C)     Temp src --      SpO2 11/08/24 1941 98 %     Weight 11/08/24 1942 200 lb (90.7 kg)     Height 11/08/24 1942 5' (1.524 m)     Head Circumference --      Peak Flow --      Pain Score 11/08/24 1941 0     Pain Loc --      Pain Education --      Exclude from Growth Chart --     Most recent vital signs: Vitals:   11/08/24 1941  BP: 138/79  Pulse: (!) 109  Resp: 18  Temp: 98.9 F (37.2 C)  SpO2: 98%    Constitutional: Alert and oriented. Eyes: Conjunctivae are normal. Head: Atraumatic. Nose: No congestion/rhinnorhea. Mouth/Throat: Mucous membranes are moist.  Cardiovascular: Normal rate, regular rhythm. Grossly normal heart sounds.  2+ radial pulses bilaterally. Respiratory: Normal respiratory effort.  No retractions. Lungs CTAB. Gastrointestinal: Soft and nontender. No  distention. Musculoskeletal: No lower extremity tenderness nor edema.  Neurologic:  Normal speech and language. No gross focal neurologic deficits are appreciated.    ED Results / Procedures / Treatments   Labs (all labs ordered are listed, but only abnormal results are displayed) Labs Reviewed - No data to display   PROCEDURES:  Critical Care performed: No  Procedures   MEDICATIONS ORDERED IN ED: Medications  ipratropium-albuterol  (DUONEB) 0.5-2.5 (3) MG/3ML nebulizer solution 3 mL (has no administration in time range)     IMPRESSION / MDM / ASSESSMENT AND PLAN / ED COURSE  I reviewed the triage vital signs and the nursing notes.                              24 y.o. female with past medical history of anemia who presents to the ED complaining of 4 days of fevers, cough, congestion, chest tightness, vomiting, and diarrhea.  Patient's presentation is most consistent with acute, uncomplicated illness.  Differential diagnosis includes, but is not limited to, influenza, dehydration, electrolyte abnormality, pneumonia.  Patient well-appearing and in no acute distress, vital signs  remarkable for mild tachycardia but otherwise reassuring.  She is afebrile here in the ED and breathing comfortably with oxygen saturations of 98% on room air.  Lungs are clear to auscultation bilaterally and I suspect viral illness, low suspicion for pneumonia.  Unfortunately, viral testing limited at this time due to high volume of influenza patients.  High suspicion for influenza and patient appropriate for outpatient management, will prescribe Mucinex  as she was already prescribed an inhaler.  She is tolerating oral intake without difficulty, counseled to return to the ED for new or worsening symptoms.  Patient agrees with plan.      FINAL CLINICAL IMPRESSION(S) / ED DIAGNOSES   Final diagnoses:  Influenza-like illness     Rx / DC Orders   ED Discharge Orders          Ordered     guaiFENesin  (MUCINEX ) 600 MG 12 hr tablet  2 times daily        11/08/24 2251             Note:  This document was prepared using Dragon voice recognition software and may include unintentional dictation errors.   Willo Dunnings, MD 11/08/24 2312  "
